# Patient Record
Sex: Female | Born: 1945 | Race: White | Hispanic: No | Marital: Married | Smoking: Never smoker
Health system: Southern US, Community
[De-identification: ages and names within clinical notes are randomized; demographics above are authoritative.]

## PROBLEM LIST (undated history)

## (undated) DIAGNOSIS — S72141A Displaced intertrochanteric fracture of right femur, initial encounter for closed fracture: Secondary | ICD-10-CM

## (undated) DIAGNOSIS — L719 Rosacea, unspecified: Secondary | ICD-10-CM

## (undated) DIAGNOSIS — K5901 Slow transit constipation: Secondary | ICD-10-CM

## (undated) DIAGNOSIS — E785 Hyperlipidemia, unspecified: Secondary | ICD-10-CM

## (undated) DIAGNOSIS — D696 Thrombocytopenia, unspecified: Secondary | ICD-10-CM

## (undated) DIAGNOSIS — N200 Calculus of kidney: Secondary | ICD-10-CM

## (undated) DIAGNOSIS — E119 Type 2 diabetes mellitus without complications: Secondary | ICD-10-CM

## (undated) DIAGNOSIS — G20 Parkinson's disease: Secondary | ICD-10-CM

## (undated) DIAGNOSIS — R2681 Unsteadiness on feet: Secondary | ICD-10-CM

## (undated) DIAGNOSIS — R600 Localized edema: Secondary | ICD-10-CM

## (undated) DIAGNOSIS — G4700 Insomnia, unspecified: Secondary | ICD-10-CM

## (undated) DIAGNOSIS — F329 Major depressive disorder, single episode, unspecified: Secondary | ICD-10-CM

## (undated) DIAGNOSIS — I10 Essential (primary) hypertension: Secondary | ICD-10-CM

## (undated) DIAGNOSIS — D62 Acute posthemorrhagic anemia: Secondary | ICD-10-CM

## (undated) HISTORY — DX: Calculus of kidney: N20.0

## (undated) HISTORY — DX: Thrombocytopenia, unspecified: D69.6

## (undated) HISTORY — PX: OTHER SURGICAL HISTORY: SHX169

## (undated) HISTORY — DX: Displaced intertrochanteric fracture of right femur, initial encounter for closed fracture: S72.141A

## (undated) HISTORY — DX: Type 2 diabetes mellitus without complications: E11.9

## (undated) HISTORY — DX: Slow transit constipation: K59.01

## (undated) HISTORY — DX: Acute posthemorrhagic anemia: D62

## (undated) HISTORY — DX: Unsteadiness on feet: R26.81

## (undated) HISTORY — DX: Major depressive disorder, single episode, unspecified: F32.9

## (undated) HISTORY — DX: Insomnia, unspecified: G47.00

## (undated) HISTORY — DX: Localized edema: R60.0

## (undated) HISTORY — DX: Hyperlipidemia, unspecified: E78.5

## (undated) HISTORY — DX: Rosacea, unspecified: L71.9

## (undated) HISTORY — DX: Essential (primary) hypertension: I10

## (undated) HISTORY — DX: Parkinson's disease: G20

## (undated) HISTORY — PX: TUBAL LIGATION: SHX77

---

## 1998-08-22 ENCOUNTER — Other Ambulatory Visit: Admission: RE | Admit: 1998-08-22 | Discharge: 1998-08-22 | Payer: Self-pay | Admitting: Obstetrics and Gynecology

## 1999-01-25 ENCOUNTER — Encounter: Payer: Self-pay | Admitting: Internal Medicine

## 1999-01-25 ENCOUNTER — Ambulatory Visit (HOSPITAL_COMMUNITY): Admission: RE | Admit: 1999-01-25 | Discharge: 1999-01-25 | Payer: Self-pay | Admitting: Internal Medicine

## 2000-01-28 ENCOUNTER — Ambulatory Visit (HOSPITAL_COMMUNITY): Admission: RE | Admit: 2000-01-28 | Discharge: 2000-01-28 | Payer: Self-pay | Admitting: Internal Medicine

## 2000-01-28 ENCOUNTER — Encounter: Payer: Self-pay | Admitting: Internal Medicine

## 2000-08-19 ENCOUNTER — Emergency Department (HOSPITAL_COMMUNITY): Admission: EM | Admit: 2000-08-19 | Discharge: 2000-08-20 | Payer: Self-pay | Admitting: Emergency Medicine

## 2000-12-31 ENCOUNTER — Encounter: Admission: RE | Admit: 2000-12-31 | Discharge: 2000-12-31 | Payer: Self-pay | Admitting: Internal Medicine

## 2000-12-31 ENCOUNTER — Encounter: Payer: Self-pay | Admitting: Internal Medicine

## 2001-02-08 ENCOUNTER — Emergency Department (HOSPITAL_COMMUNITY): Admission: EM | Admit: 2001-02-08 | Discharge: 2001-02-08 | Payer: Self-pay | Admitting: Emergency Medicine

## 2002-01-18 ENCOUNTER — Other Ambulatory Visit: Admission: RE | Admit: 2002-01-18 | Discharge: 2002-01-18 | Payer: Self-pay | Admitting: Obstetrics and Gynecology

## 2002-01-21 ENCOUNTER — Encounter: Payer: Self-pay | Admitting: Internal Medicine

## 2002-01-21 ENCOUNTER — Encounter: Admission: RE | Admit: 2002-01-21 | Discharge: 2002-01-21 | Payer: Self-pay | Admitting: Orthopedic Surgery

## 2003-01-12 ENCOUNTER — Encounter: Payer: Self-pay | Admitting: Internal Medicine

## 2003-01-12 ENCOUNTER — Encounter: Admission: RE | Admit: 2003-01-12 | Discharge: 2003-01-12 | Payer: Self-pay | Admitting: Internal Medicine

## 2003-01-23 ENCOUNTER — Other Ambulatory Visit: Admission: RE | Admit: 2003-01-23 | Discharge: 2003-01-23 | Payer: Self-pay | Admitting: Obstetrics and Gynecology

## 2003-09-25 ENCOUNTER — Emergency Department (HOSPITAL_COMMUNITY): Admission: EM | Admit: 2003-09-25 | Discharge: 2003-09-25 | Payer: Self-pay | Admitting: Emergency Medicine

## 2004-02-01 ENCOUNTER — Encounter: Admission: RE | Admit: 2004-02-01 | Discharge: 2004-02-01 | Payer: Self-pay | Admitting: Internal Medicine

## 2004-03-12 ENCOUNTER — Ambulatory Visit: Payer: Self-pay | Admitting: Internal Medicine

## 2004-03-28 ENCOUNTER — Ambulatory Visit: Payer: Self-pay | Admitting: Internal Medicine

## 2004-04-16 ENCOUNTER — Ambulatory Visit: Payer: Self-pay | Admitting: Internal Medicine

## 2004-05-13 ENCOUNTER — Ambulatory Visit (HOSPITAL_COMMUNITY): Admission: RE | Admit: 2004-05-13 | Discharge: 2004-05-13 | Payer: Self-pay | Admitting: Urology

## 2004-05-27 ENCOUNTER — Ambulatory Visit (HOSPITAL_COMMUNITY): Admission: RE | Admit: 2004-05-27 | Discharge: 2004-05-27 | Payer: Self-pay | Admitting: Urology

## 2004-07-30 ENCOUNTER — Ambulatory Visit: Payer: Self-pay | Admitting: Internal Medicine

## 2004-09-30 ENCOUNTER — Ambulatory Visit: Payer: Self-pay | Admitting: Internal Medicine

## 2004-11-08 ENCOUNTER — Ambulatory Visit: Payer: Self-pay | Admitting: Internal Medicine

## 2004-12-30 ENCOUNTER — Encounter: Payer: Self-pay | Admitting: Internal Medicine

## 2005-01-13 ENCOUNTER — Ambulatory Visit: Payer: Self-pay | Admitting: Internal Medicine

## 2005-01-21 ENCOUNTER — Encounter: Admission: RE | Admit: 2005-01-21 | Discharge: 2005-01-21 | Payer: Self-pay | Admitting: Internal Medicine

## 2005-01-27 ENCOUNTER — Ambulatory Visit: Payer: Self-pay | Admitting: Internal Medicine

## 2005-05-22 ENCOUNTER — Encounter (INDEPENDENT_AMBULATORY_CARE_PROVIDER_SITE_OTHER): Payer: Self-pay | Admitting: Specialist

## 2005-05-22 ENCOUNTER — Encounter: Admission: RE | Admit: 2005-05-22 | Discharge: 2005-05-22 | Payer: Self-pay | Admitting: Obstetrics and Gynecology

## 2005-09-22 ENCOUNTER — Ambulatory Visit: Payer: Self-pay | Admitting: Internal Medicine

## 2005-10-16 ENCOUNTER — Ambulatory Visit: Payer: Self-pay | Admitting: Internal Medicine

## 2005-11-20 ENCOUNTER — Ambulatory Visit: Payer: Self-pay | Admitting: Internal Medicine

## 2006-02-10 ENCOUNTER — Ambulatory Visit: Payer: Self-pay | Admitting: Internal Medicine

## 2006-05-28 ENCOUNTER — Ambulatory Visit: Payer: Self-pay | Admitting: Internal Medicine

## 2006-08-14 ENCOUNTER — Ambulatory Visit: Payer: Self-pay | Admitting: Internal Medicine

## 2006-08-31 ENCOUNTER — Ambulatory Visit: Payer: Self-pay | Admitting: Internal Medicine

## 2006-08-31 LAB — CONVERTED CEMR LAB
Calcium: 9.4 mg/dL (ref 8.4–10.5)
Chloride: 109 meq/L (ref 96–112)
GFR calc Af Amer: 94 mL/min
GFR calc non Af Amer: 78 mL/min
Glucose, Bld: 157 mg/dL — ABNORMAL HIGH (ref 70–99)
Hgb A1c MFr Bld: 7.3 % — ABNORMAL HIGH (ref 4.6–6.0)
LDL Cholesterol: 61 mg/dL (ref 0–99)
Total CHOL/HDL Ratio: 3.6
VLDL: 38 mg/dL (ref 0–40)

## 2006-09-07 ENCOUNTER — Ambulatory Visit: Payer: Self-pay | Admitting: Internal Medicine

## 2006-10-06 ENCOUNTER — Ambulatory Visit: Payer: Self-pay | Admitting: Endocrinology

## 2006-11-16 ENCOUNTER — Encounter: Payer: Self-pay | Admitting: Internal Medicine

## 2006-11-16 DIAGNOSIS — L719 Rosacea, unspecified: Secondary | ICD-10-CM

## 2006-11-16 DIAGNOSIS — I10 Essential (primary) hypertension: Secondary | ICD-10-CM | POA: Insufficient documentation

## 2006-11-16 DIAGNOSIS — E118 Type 2 diabetes mellitus with unspecified complications: Secondary | ICD-10-CM

## 2006-12-31 ENCOUNTER — Ambulatory Visit: Payer: Self-pay | Admitting: Internal Medicine

## 2007-01-18 LAB — CONVERTED CEMR LAB: Pap Smear: NORMAL

## 2007-01-21 ENCOUNTER — Encounter: Admission: RE | Admit: 2007-01-21 | Discharge: 2007-01-21 | Payer: Self-pay | Admitting: Internal Medicine

## 2007-03-09 ENCOUNTER — Ambulatory Visit: Payer: Self-pay | Admitting: Internal Medicine

## 2007-03-09 LAB — CONVERTED CEMR LAB
Calcium: 9.8 mg/dL (ref 8.4–10.5)
Chloride: 101 meq/L (ref 96–112)
Cholesterol: 105 mg/dL (ref 0–200)
Creatinine, Ser: 0.7 mg/dL (ref 0.4–1.2)
GFR calc non Af Amer: 90 mL/min
Glucose, Bld: 177 mg/dL — ABNORMAL HIGH (ref 70–99)
Hgb A1c MFr Bld: 7.6 % — ABNORMAL HIGH (ref 4.6–6.0)
Sodium: 140 meq/L (ref 135–145)
Total CHOL/HDL Ratio: 3.2
VLDL: 46 mg/dL — ABNORMAL HIGH (ref 0–40)

## 2007-03-11 ENCOUNTER — Encounter: Payer: Self-pay | Admitting: Internal Medicine

## 2007-03-16 ENCOUNTER — Telehealth: Payer: Self-pay | Admitting: Internal Medicine

## 2007-03-22 ENCOUNTER — Encounter: Payer: Self-pay | Admitting: Internal Medicine

## 2007-03-23 ENCOUNTER — Ambulatory Visit: Payer: Self-pay | Admitting: Internal Medicine

## 2007-04-13 ENCOUNTER — Ambulatory Visit: Payer: Self-pay | Admitting: Internal Medicine

## 2007-04-13 DIAGNOSIS — K3189 Other diseases of stomach and duodenum: Secondary | ICD-10-CM | POA: Insufficient documentation

## 2007-04-13 DIAGNOSIS — R1013 Epigastric pain: Secondary | ICD-10-CM

## 2007-04-14 ENCOUNTER — Telehealth: Payer: Self-pay | Admitting: Internal Medicine

## 2007-04-19 ENCOUNTER — Telehealth: Payer: Self-pay | Admitting: Internal Medicine

## 2007-08-19 ENCOUNTER — Telehealth: Payer: Self-pay | Admitting: Internal Medicine

## 2007-08-31 ENCOUNTER — Ambulatory Visit: Payer: Self-pay | Admitting: Internal Medicine

## 2007-09-07 ENCOUNTER — Ambulatory Visit: Payer: Self-pay | Admitting: Internal Medicine

## 2007-09-13 ENCOUNTER — Telehealth (INDEPENDENT_AMBULATORY_CARE_PROVIDER_SITE_OTHER): Payer: Self-pay | Admitting: *Deleted

## 2007-09-22 ENCOUNTER — Ambulatory Visit: Payer: Self-pay | Admitting: Internal Medicine

## 2007-10-04 ENCOUNTER — Telehealth: Payer: Self-pay | Admitting: Internal Medicine

## 2007-10-13 ENCOUNTER — Telehealth: Payer: Self-pay | Admitting: Internal Medicine

## 2007-11-03 ENCOUNTER — Ambulatory Visit: Payer: Self-pay | Admitting: Internal Medicine

## 2007-11-03 DIAGNOSIS — E785 Hyperlipidemia, unspecified: Secondary | ICD-10-CM | POA: Insufficient documentation

## 2007-11-03 DIAGNOSIS — Z9189 Other specified personal risk factors, not elsewhere classified: Secondary | ICD-10-CM | POA: Insufficient documentation

## 2007-11-03 DIAGNOSIS — Z87442 Personal history of urinary calculi: Secondary | ICD-10-CM | POA: Insufficient documentation

## 2007-11-04 LAB — CONVERTED CEMR LAB
ALT: 18 units/L (ref 0–35)
AST: 20 units/L (ref 0–37)
Albumin: 3.9 g/dL (ref 3.5–5.2)
Alkaline Phosphatase: 54 units/L (ref 39–117)
BUN: 9 mg/dL (ref 6–23)
Basophils Relative: 0.7 % (ref 0.0–3.0)
Bilirubin Urine: NEGATIVE
CO2: 31 meq/L (ref 19–32)
Chloride: 100 meq/L (ref 96–112)
Eosinophils Relative: 5.4 % — ABNORMAL HIGH (ref 0.0–5.0)
GFR calc non Af Amer: 67 mL/min
Glucose, Bld: 230 mg/dL — ABNORMAL HIGH (ref 70–99)
Hemoglobin: 12.2 g/dL (ref 12.0–15.0)
Lymphocytes Relative: 27 % (ref 12.0–46.0)
Mucus, UA: NEGATIVE
Neutro Abs: 4.3 10*3/uL (ref 1.4–7.7)
Neutrophils Relative %: 57.1 % (ref 43.0–77.0)
Potassium: 4.8 meq/L (ref 3.5–5.1)
RBC / HPF: NONE SEEN
RBC: 4.06 M/uL (ref 3.87–5.11)
Specific Gravity, Urine: 1.02 (ref 1.000–1.03)
Total Protein, Urine: NEGATIVE mg/dL
Urine Glucose: 100 mg/dL — AB
WBC: 7.6 10*3/uL (ref 4.5–10.5)
pH: 5.5 (ref 5.0–8.0)

## 2007-11-18 ENCOUNTER — Telehealth: Payer: Self-pay | Admitting: Internal Medicine

## 2008-01-10 ENCOUNTER — Encounter: Payer: Self-pay | Admitting: Internal Medicine

## 2008-01-13 ENCOUNTER — Encounter: Payer: Self-pay | Admitting: Internal Medicine

## 2008-02-01 ENCOUNTER — Encounter: Admission: RE | Admit: 2008-02-01 | Discharge: 2008-02-01 | Payer: Self-pay | Admitting: Internal Medicine

## 2008-02-07 ENCOUNTER — Ambulatory Visit: Payer: Self-pay | Admitting: Internal Medicine

## 2008-02-15 ENCOUNTER — Ambulatory Visit: Payer: Self-pay | Admitting: Internal Medicine

## 2008-02-15 LAB — CONVERTED CEMR LAB
Calcium: 9.1 mg/dL (ref 8.4–10.5)
Chloride: 105 meq/L (ref 96–112)
Creatinine, Ser: 0.7 mg/dL (ref 0.4–1.2)
GFR calc non Af Amer: 90 mL/min
HDL: 36.7 mg/dL — ABNORMAL LOW (ref >39.0)
Hgb A1c MFr Bld: 7.4 % — ABNORMAL HIGH (ref 4.6–6.0)
LDL Cholesterol: 43 mg/dL (ref 0–99)
Sodium: 141 meq/L (ref 135–145)
Total CHOL/HDL Ratio: 2.6
Triglycerides: 88 mg/dL (ref 0–149)

## 2008-02-17 ENCOUNTER — Encounter: Payer: Self-pay | Admitting: Internal Medicine

## 2008-02-21 ENCOUNTER — Telehealth: Payer: Self-pay | Admitting: Internal Medicine

## 2008-04-11 ENCOUNTER — Ambulatory Visit: Payer: Self-pay | Admitting: Internal Medicine

## 2008-06-29 ENCOUNTER — Telehealth: Payer: Self-pay | Admitting: Internal Medicine

## 2008-11-06 ENCOUNTER — Ambulatory Visit: Payer: Self-pay | Admitting: Internal Medicine

## 2008-11-07 DIAGNOSIS — E669 Obesity, unspecified: Secondary | ICD-10-CM

## 2008-11-08 LAB — CONVERTED CEMR LAB
BUN: 16 mg/dL (ref 6–23)
Basophils Relative: 0.2 % (ref 0.0–3.0)
Calcium: 9.4 mg/dL (ref 8.4–10.5)
Eosinophils Relative: 5.1 % — ABNORMAL HIGH (ref 0.0–5.0)
GFR calc non Af Amer: 76.8 mL/min (ref >60)
Glucose, Bld: 144 mg/dL — ABNORMAL HIGH (ref 70–99)
HCT: 38 % (ref 36.0–46.0)
Lymphs Abs: 1.9 10*3/uL (ref 0.7–4.0)
MCV: 86.8 fL (ref 78.0–100.0)
Monocytes Absolute: 0.7 10*3/uL (ref 0.1–1.0)
Neutro Abs: 4.1 10*3/uL (ref 1.4–7.7)
RBC: 4.38 M/uL (ref 3.87–5.11)
WBC: 7.1 10*3/uL (ref 4.5–10.5)

## 2008-11-13 ENCOUNTER — Telehealth: Payer: Self-pay | Admitting: Internal Medicine

## 2008-11-21 ENCOUNTER — Telehealth: Payer: Self-pay | Admitting: Internal Medicine

## 2008-11-24 ENCOUNTER — Telehealth: Payer: Self-pay | Admitting: Internal Medicine

## 2008-12-11 ENCOUNTER — Telehealth: Payer: Self-pay | Admitting: Internal Medicine

## 2009-02-01 ENCOUNTER — Encounter: Admission: RE | Admit: 2009-02-01 | Discharge: 2009-02-01 | Payer: Self-pay | Admitting: Internal Medicine

## 2009-02-14 ENCOUNTER — Telehealth (INDEPENDENT_AMBULATORY_CARE_PROVIDER_SITE_OTHER): Payer: Self-pay | Admitting: *Deleted

## 2009-03-02 ENCOUNTER — Telehealth: Payer: Self-pay | Admitting: Internal Medicine

## 2009-03-19 ENCOUNTER — Telehealth: Payer: Self-pay | Admitting: Internal Medicine

## 2009-03-27 ENCOUNTER — Ambulatory Visit: Payer: Self-pay | Admitting: Internal Medicine

## 2009-04-16 ENCOUNTER — Ambulatory Visit: Payer: Self-pay | Admitting: Internal Medicine

## 2009-04-27 ENCOUNTER — Telehealth: Payer: Self-pay | Admitting: Internal Medicine

## 2009-05-14 ENCOUNTER — Telehealth: Payer: Self-pay | Admitting: Internal Medicine

## 2009-06-29 ENCOUNTER — Telehealth: Payer: Self-pay | Admitting: Internal Medicine

## 2009-08-07 ENCOUNTER — Telehealth: Payer: Self-pay | Admitting: Internal Medicine

## 2009-09-10 ENCOUNTER — Telehealth: Payer: Self-pay | Admitting: Internal Medicine

## 2009-09-12 ENCOUNTER — Telehealth: Payer: Self-pay | Admitting: Internal Medicine

## 2009-10-04 ENCOUNTER — Telehealth: Payer: Self-pay | Admitting: Internal Medicine

## 2009-12-21 ENCOUNTER — Ambulatory Visit: Payer: Self-pay | Admitting: Internal Medicine

## 2010-01-11 ENCOUNTER — Encounter: Payer: Self-pay | Admitting: Internal Medicine

## 2010-01-14 ENCOUNTER — Ambulatory Visit: Payer: Self-pay | Admitting: Internal Medicine

## 2010-01-14 ENCOUNTER — Encounter: Payer: Self-pay | Admitting: Internal Medicine

## 2010-01-14 LAB — CONVERTED CEMR LAB
AST: 22 units/L (ref 0–37)
Alkaline Phosphatase: 58 units/L (ref 39–117)
Basophils Relative: 0.6 % (ref 0.0–3.0)
CO2: 29 meq/L (ref 19–32)
Calcium: 10.6 mg/dL — ABNORMAL HIGH (ref 8.4–10.5)
Eosinophils Absolute: 0.3 10*3/uL (ref 0.0–0.7)
Glucose, Bld: 93 mg/dL (ref 70–99)
HCT: 38.7 % (ref 36.0–46.0)
HDL: 43.3 mg/dL (ref >39.00)
Hemoglobin: 13.1 g/dL (ref 12.0–15.0)
Lymphs Abs: 2.5 10*3/uL (ref 0.7–4.0)
MCHC: 33.9 g/dL (ref 30.0–36.0)
MCV: 87.1 fL (ref 78.0–100.0)
Monocytes Absolute: 0.6 10*3/uL (ref 0.1–1.0)
Neutro Abs: 4 10*3/uL (ref 1.4–7.7)
Neutrophils Relative %: 53.8 % (ref 43.0–77.0)
RBC: 4.44 M/uL (ref 3.87–5.11)
Sodium: 142 meq/L (ref 135–145)
Total Bilirubin: 0.8 mg/dL (ref 0.3–1.2)
Total CHOL/HDL Ratio: 2

## 2010-01-23 ENCOUNTER — Telehealth: Payer: Self-pay | Admitting: Internal Medicine

## 2010-02-04 ENCOUNTER — Encounter: Admission: RE | Admit: 2010-02-04 | Discharge: 2010-02-04 | Payer: Self-pay | Admitting: Internal Medicine

## 2010-02-05 ENCOUNTER — Telehealth: Payer: Self-pay | Admitting: Internal Medicine

## 2010-04-04 ENCOUNTER — Telehealth: Payer: Self-pay | Admitting: Internal Medicine

## 2010-04-18 ENCOUNTER — Telehealth: Payer: Self-pay | Admitting: Internal Medicine

## 2010-04-30 NOTE — Progress Notes (Signed)
Summary: Accupril refill  Phone Note Refill Request Message from:  Fax from Pharmacy on April 27, 2009 1:35 PM  Refills Requested: Medication #1:  ACCUPRIL 10 MG  TABS Take 1 tablet by mouth once a day   Dosage confirmed as above?Dosage Confirmed Initial call taken by: Lucious Groves,  April 27, 2009 1:35 PM    Prescriptions: ACCUPRIL 10 MG  TABS (QUINAPRIL HCL) Take 1 tablet by mouth once a day  #30 x 6   Entered by:   Lucious Groves   Authorized by:   Jacques Navy MD   Signed by:   Lucious Groves on 04/27/2009   Method used:   Faxed to ...       [facility] Department (retail)       [address] [place], [place]  [address]       Ph: [phone]       Fax: [phone]   RxID:   [id number]

## 2010-04-30 NOTE — Progress Notes (Signed)
     Diabetes Management Exam:    Eye Exam:       Eye Exam done elsewhere          Date: 01/11/2010          Results: normal          Done by: Dr Francesca Oman

## 2010-04-30 NOTE — Progress Notes (Signed)
  Phone Note Refill Request Message from:  Patient on September 10, 2009 2:43 PM  Refills Requested: Medication #1:  PAXIL 20 MG  TABS once daily Initial call taken by: Ami Bullins CMA,  September 10, 2009 2:43 PM    Prescriptions: PAXIL 20 MG  TABS (PAROXETINE HCL) once daily  #90.0 Each x 3   Entered by:   Ami Bullins CMA   Authorized by:   Jacques Navy MD   Signed by:   Bill Salinas CMA on 09/10/2009   Method used:   Electronically to        [facility] [address] (retail)       [phone]       [place], [place]  [address]       Ph: [phone]       Fax: [phone]   RxID:   [id number]

## 2010-04-30 NOTE — Progress Notes (Signed)
  Phone Note Refill Request Message from:  Fax from Pharmacy on October 04, 2009 3:44 PM  Refills Requested: Medication #1:  ACCUPRIL 10 MG  TABS Take 1 tablet by mouth once a day Initial call taken by: Ami Bullins CMA,  October 04, 2009 3:45 PM    Prescriptions: ACCUPRIL 10 MG  TABS (QUINAPRIL HCL) Take 1 tablet by mouth once a day  #30 x 6   Entered by:   Ami Bullins CMA   Authorized by:   Jacques Navy MD   Signed by:   Bill Salinas CMA on 10/04/2009   Method used:   Faxed to ...       [facility] Department (retail)       [address] [place], [place]  [address]       Ph: [phone]       Fax: [phone]   RxID:   [id number]

## 2010-04-30 NOTE — Progress Notes (Signed)
  Phone Note Refill Request   Refills Requested: Medication #1:  METFORMIN HCL 1000 MG  TABS two times a day Initial call taken by: Rock Nephew CMA,  Aug 07, 2009 3:05 PM    Prescriptions: METFORMIN HCL 1000 MG  TABS (METFORMIN HCL) two times a day  #180 x 3   Entered by:   Rock Nephew CMA   Authorized by:   Jacques Navy MD   Signed by:   Rock Nephew CMA on 08/07/2009   Method used:   Faxed to ...       [facility] Department (retail)       [address] [place], [place]  [address]       Ph: [phone]       Fax: [phone]   RxID:   [id number]

## 2010-04-30 NOTE — Progress Notes (Signed)
Summary: Zostavax  Phone Note Call from Patient   Summary of Call: Pt has not met her deductable for Med Part D. She wants to wait until next year when she meets her deductable to get zostavax. Pt wants to know if this is ok or is it urgent that she gets this now? If now, it will cost around 165 dollars.  Initial call taken by: [name] Sprinkles, CMA,  February 05, 2010 4:47 PM  Follow-up for Phone Call        ok to wait Follow-up by: Jacques Navy MD,  February 05, 2010 6:02 PM  Additional Follow-up for Phone Call Additional follow up Details #1::        Pt informed  Additional Follow-up by: [name] Sprinkles, CMA,  February 05, 2010 6:21 PM

## 2010-04-30 NOTE — Assessment & Plan Note (Signed)
Summary: FLU VAC MEN  STC  Nurse Visit   Allergies: No Known Drug Allergies  Orders Added: 1)  Flu Vaccine 3yrs + MEDICARE PATIENTS [Q2039] 2)  Administration Flu vaccine - MCR [G0008]      Flu Vaccine Consent Questions     Do you have a history of severe allergic reactions to this vaccine? no    Any prior history of allergic reactions to egg and/or gelatin? no    Do you have a sensitivity to the preservative Thimersol? no    Do you have a past history of Guillan-Barre Syndrome? no    Do you currently have an acute febrile illness? no    Have you ever had a severe reaction to latex? no    Vaccine information given and explained to patient? yes    Are you currently pregnant? no    Lot Number:[id number]   Exp Date:09/28/2010   Site Given  Left Deltoid IMu  

## 2010-04-30 NOTE — Assessment & Plan Note (Signed)
Summary: Yearly f/u    Vital Signs:  Patient profile:   65 year old female Height:      65 inches Weight:      208 pounds BMI:     34.74 O2 Sat:      95 % on Room air Temp:     97.1 degrees F oral Pulse rate:   69 / minute BP sitting:   118 / 62  (left arm) Cuff size:   large  Vitals Entered By: Alysia Penna (January 14, 2010 2:49 PM)  O2 Flow:  Room air CC: pt here for cpx. /cp sma   Primary Care Provider:  Norins  CC:  pt here for cpx. /cp sma.  History of Present Illness: Patinet presents for a routine physical exam. She has had a recent case of pink eye - see by Dr. Milus Glazier at Urgent Care and treated with sample antibiotic drops. She has had no other acute medical problems since last visit.   She remains independent ADLs. 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  She has been on a weight loss program. she lost 25lbs and found 10 lbs. With this change she has had better CBGs and has reduced her metformin to 1000mg  AM and 500mg  PM. Her CBGs have been in the 70-'s-80s.  Preventive Screening-Counseling & Management  Alcohol-Tobacco     Alcohol drinks/day: 0     Smoking Status: never  Caffeine-Diet-Exercise     Caffeine use/day: 2-3 glasses/day     Diet Comments: diabetic visit     Does Patient Exercise: no     MSH Depression Score: no depression  Hep-HIV-STD-Contraception     Hepatitis Risk: no risk noted     HIV Risk: no risk noted     STD Risk: no risk noted     Dental Visit-last 6 months no     SBE monthly: no     Sun Exposure-Excessive: no  Safety-Violence-Falls     Seat Belt Use: yes     Firearms in the Home: no firearms in the home     Smoke Detectors: yes     Violence in the Home: no risk noted     Sexual Abuse: no     Fall Risk: no      Sexual History:  currently monogamous.        Drug Use:  never.        Blood Transfusions:  no.    Current Medications (verified): 1)  Glucotrol Xl  10 Mg Xr24h-Tab (Glipizide) .... Take 1 Tablet By Mouth Once A Day 2)  Actos 30 Mg Tabs (Pioglitazone Hcl) .... One Daily - Stop Avandia 3)  Metformin Hcl 1000 Mg  Tabs (Metformin Hcl) .... Two Times A Day 4)  Paxil 20 Mg  Tabs (Paroxetine Hcl) .... Once Daily 5)  Accupril 10 Mg  Tabs (Quinapril Hcl) .... Take 1 Tablet By Mouth Once A Day 6)  Crestor 10 Mg  Tabs (Rosuvastatin Calcium) .... Every Pm 7)  Furosemide 40 Mg  Tabs (Furosemide) .Marland Kitchen.. 1 Once Daily 8)  Zyrtec Allergy 10 Mg Tabs (Cetirizine Hcl) .... Take 1 Tablet By Mouth Once A Day As Needed 9)  Aspirin 81 Mg  Tabs (Aspirin) .... Take 1 Tablet By Mouth Once A Day 10)  Multivitamins   Tabs (Multiple Vitamin) .... Take 1 Tablet By Mouth Once A Day 11)  Calcium 600 600 Mg Tabs (Calcium Carbonate) .... Take 1 Tablet By  Mouth Once A Day 12)  Vitamin D 400 Unit Tabs (Cholecalciferol) .... Take 1 Tablet By Mouth Once A Day 13)  Vitamin E .... Take 1 Tablet By Mouth Once A Day 14)  Vitamin B .... Take 1 Tablet By Mouth Once A Day 15)  Betamethasone Dipropionate 0.05 % Crea (Betamethasone Dipropionate) .... Apply Two Times A Day As Needed 16)  Promethazine-Codeine 6.25-10 Mg/49ml Syrp (Promethazine-Codeine) .Marland Kitchen.. 1 Tsp Q 6 For Cough. May Take 2 Tsp At Bedtime. 17)  Prednisone 20 Mg Tabs (Prednisone) .Marland Kitchen.. 1 By Mouth Once Daily 18)  Benzonatate 100 Mg Caps (Benzonatate) .Marland Kitchen.. 1 By Mouth Three Times A Day 19)  Gas-X Extra Strength 125 Mg Caps (Simethicone) .Marland Kitchen.. 1 Tablet 3 Times A Day 20)  Aleve 220 Mg Tabs (Naproxen Sodium) .Marland Kitchen.. 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 married 1966 3 sons - 1968, 1974, 80 3 grandchildren retired(disability) Lives with SO - I-ADLs  Risk Factors: Alcohol Use: 0 (01/14/2010) Caffeine Use: 2-3 glasses/day (01/14/2010) Diet: diabetic visit (01/14/2010) Exercise: no (01/14/2010)  Risk Factors: Smoking Status: never (01/14/2010)  Social History: Caffeine use/day:  2-3 glasses/day Hepatitis Risk:  no risk noted HIV Risk:  no risk noted STD Risk:  no risk noted Dental Care w/in 6 mos.:  no Sun Exposure-Excessive:  no Seat Belt Use:  yes Fall Risk:  no Sexual History:  currently monogamous Drug Use:  never Blood Transfusions:  no  Review of Systems       The patient complains of weight loss and peripheral edema.  The patient denies anorexia, fever, weight gain, vision loss, decreased hearing, chest pain, syncope, dyspnea on exertion, headaches, abdominal pain, severe indigestion/heartburn, incontinence, transient blindness, difficulty walking, depression, abnormal bleeding, and angioedema.         lost 25lbs, regained 10lb - net loss 15 lbs  Physical Exam  General:  obese white female in no acute distress Head:  Normocephalic and atraumatic without obvious abnormalities. No apparent alopecia or balding. Eyes:  vision grossly intact, pupils equal, pupils round, pupils reactive to light, corneas and lenses clear, and no injection.   Ears:  External ear exam shows no significant lesions or deformities.  Otoscopic examination reveals clear canals, tympanic membranes are intact bilaterally without bulging, retraction, inflammation or discharge. Hearing is grossly normal bilaterally. Nose:  no external deformity and no external erythema.   Mouth:  missing many teeth. No obvious caries in remaining teeth. No buccal lesions, no palatal lesions, throat  clear. Neck:  supple, full ROM, no thyromegaly, and no carotid bruits.   Chest Wall:  No deformities, masses, or tenderness noted. Breasts:  No mass, nodules, thickening, tenderness, bulging, retraction, inflamation, nipple discharge or skin changes noted.   Lungs:  Normal respiratory effort, chest expands symmetrically. Lungs are clear to auscultation, no crackles or wheezes. Heart:  Normal rate and regular rhythm. S1 and S2 normal without gallop, murmur, click, rub or other extra sounds. Abdomen:  soft, non-tender, normal bowel sounds, no guarding, no rigidity, and no hepatomegaly.  Genitalia:  deferred Msk:  no joint tenderness, no joint swelling, no joint warmth, no redness over joints, and no joint deformities.   Pulses:  2+ radial and DP pulses Extremities:  No clubbing, cyanosis, edema, or deformity noted with normal full range of motion of all joints.   Neurologic:  alert & oriented X3, cranial nerves II-XII intact, strength normal in all extremities, gait normal, and DTRs symmetrical and normal.   Skin:  turgor normal, color normal, and no suspicious lesions.   Cervical Nodes:  no anterior cervical adenopathy and no posterior cervical adenopathy.   Axillary Nodes:  no R axillary adenopathy and no L axillary adenopathy.   Psych:  Oriented X3, normally interactive, good eye contact, and not anxious appearing.     Impression & Recommendations:  Problem # 1:  OBESITY, CLASS III (ICD-278.01) She has been loosing weight and is encouraged to continue her weight management program. Target is 180 lbs, goal is to loose 1 lb a month.  Problem # 2:  HYPERLIPIDEMIA (ICD-272.4)  Her updated medication list for this problem includes:    Crestor 10 Mg Tabs (Rosuvastatin calcium) ..... Every pm  Orders: TLB-Lipid Panel (80061-LIPID) TLB-Hepatic/Liver Function Pnl (80076-HEPATIC) TLB-TSH (Thyroid Stimulating Hormone) (84443-TSH)  Addendum - excellent control. Plan - continue present dose of  crestor.  Problem # 3:  DYSPEPSIA (ICD-536.8) Stable with no active signs of irritation or bleeding.   Orders: TLB-CBC Platelet - w/Differential (85025-CBCD)  Problem # 4:  ROSACEA (ICD-695.3) Minimal facial erythema at this time. NO need to re-institute therapy  Problem # 5:  HYPERTENSION (ICD-401.9)  Her updated medication list for this problem includes:    Accupril 10 Mg Tabs (Quinapril hcl) .Marland Kitchen... Take 1 tablet by mouth once a day    Furosemide 40 Mg Tabs (Furosemide) .Marland Kitchen... 1 once daily  Orders: TLB-BMP (Basic Metabolic Panel-BMET) (80048-METABOL)  BP today: 118/62 Prior BP: 120/60 (04/16/2009)  Very good control on present medical regimen.  Problem # 6:  DIABETES MELLITUS, TYPE II (ICD-250.00)  Her updated medication list for this problem includes:    Glucotrol Xl 10 Mg Xr24h-tab (Glipizide) .Marland Kitchen... Take 1 tablet by mouth once a day    Actos 30 Mg Tabs (Pioglitazone hcl) ..... One daily - stop avandia    Metformin Hcl 1000 Mg Tabs (Metformin hcl) .Marland Kitchen..Marland Kitchen Two times a day    Accupril 10 Mg Tabs (Quinapril hcl) .Marland Kitchen... Take 1 tablet by mouth once a day    Aspirin 81 Mg Tabs (Aspirin) .Marland Kitchen... Take 1 tablet by mouth once a day  Orders: TLB-A1C / Hgb A1C (Glycohemoglobin) (83036-A1C)  Addendum - A1C 6.3% = GREAT control. Plan continue lifestyle managment and present medications.  Problem # 7:  Preventive Health Care (ICD-V70.0)  History remarkable for weight loss with subsequent decreased need for diabetes medications. Physical exam without abnormality except for weight. Lab results are within normal limits and look great. No colonoscopy on record. She is counselled as to the importance of screening for colorectal cancer. Last mammogram Nov '10. Immunizations: tetnus March '05, peumonia vaccine today. She will look into coverage for shingles vaccine.   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  IN summary - a  nice woman with multiple medical problems who appears to be stable at today's exam. She will return for routine follow-up in 6 months.   Orders: Medicare -1st Annual Wellness Visit [phone])  Complete Medication List: 1)  Glucotrol Xl  10 Mg Xr24h-tab (Glipizide) .... Take 1 tablet by mouth once a day 2)  Actos 30 Mg Tabs (Pioglitazone hcl) .... One daily - stop avandia 3)  Metformin Hcl 1000 Mg Tabs (Metformin hcl) .... Two times a day 4)  Paxil 20 Mg Tabs (Paroxetine hcl) .... Once daily 5)  Accupril 10 Mg Tabs (Quinapril hcl) .... Take 1 tablet by mouth once a day 6)  Crestor 10 Mg Tabs (Rosuvastatin calcium) .... Every pm 7)  Furosemide 40 Mg Tabs (Furosemide) .Marland Kitchen.. 1 once daily 8)  Zyrtec Allergy 10 Mg Tabs (Cetirizine hcl) .... Take 1 tablet by mouth once a day as needed 9)  Aspirin 81 Mg Tabs (Aspirin) .... Take 1 tablet by mouth once a day 10)  Multivitamins Tabs (Multiple vitamin) .... Take 1 tablet by mouth once a day 11)  Calcium 600 600 Mg Tabs (Calcium carbonate) .... Take 1 tablet by mouth once a day 12)  Vitamin D 400 Unit Tabs (Cholecalciferol) .... Take 2 tablets by mouth once a day 13)  Vitamin E  .... Take 1 tablet by mouth once a day 14)  Vitamin B  .... Take 1 tablet by mouth once a day 15)  Betamethasone Dipropionate 0.05 % Crea (Betamethasone dipropionate) .... Apply two times a day as needed 16)  Gas-x Extra Strength 125 Mg Caps (Simethicone) .Marland Kitchen.. 1 tablet 3 times a day 17)  Aleve 220 Mg Tabs (Naproxen sodium) .Marland Kitchen.. 1 tablet a day  Other Orders: Pneumococcal Vaccine ([address]) Admin 1st Vaccine (82956)  Patient: Barbara Bradshaw Note: All result statuses are Final unless otherwise noted.  Tests: (1) Lipid Panel (LIPID)   Cholesterol               108 mg/dL                   2-130     ATP III Classification            Desirable:  < 200 mg/dL                    Borderline High:  200 - 239 mg/dL               High:  > = 240 mg/dL   Triglycerides             108.0 mg/dL                  8.6-578.4     Normal:  <150 mg/dL     Borderline High:  696 - 199 mg/dL   HDL                       29.52 mg/dL                 >84.13   VLDL Cholesterol          21.6 mg/dL                  2.4-40.1   LDL Cholesterol           43 mg/dL                    0-27  CHO/HDL Ratio:  CHD Risk                             2  Men          Women     1/2 Average Risk     3.4          3.3     Average Risk          5.0          4.4     2X Average Risk          9.6          7.1     3X Average Risk          15.0          11.0                           Tests: (2) Hepatic/Liver Function Panel (HEPATIC)   Total Bilirubin           0.8 mg/dL                   1.3-0.8   Direct Bilirubin          0.1 mg/dL                   6.5-7.8   Alkaline Phosphatase      58 U/L                      39-117   AST                       22 U/L                      0-37   ALT                       18 U/L                      0-35   Total Protein             7.2 g/dL                    4.6-9.6   Albumin                   4.5 g/dL                    2.9-5.2  Tests: (3) BMP (METABOL)   Sodium                    142 mEq/L                   135-145   Potassium                 4.7 mEq/L                   3.5-5.1   Chloride                  102 mEq/L                   96-112   Carbon Dioxide            29 mEq/L                    19-32   Glucose  93 mg/dL                    04-54   BUN                       18 mg/dL                    0-98   Creatinine                0.9 mg/dL                   1.1-9.1   Calcium              [H]  10.6 mg/dL                  4.7-82.9   GFR                       65.12 mL/min                >60  Tests: (4) Hemoglobin A1C (A1C)   Hemoglobin A1C            6.3 %                       4.6-6.5     Glycemic Control Guidelines for People with Diabetes:     Non Diabetic:  <6%     Goal of Therapy: <7%     Additional Action Suggested:  >8%   Tests:  (5) CBC Platelet w/Diff (CBCD)   White Cell Count          7.4 K/uL                    4.5-10.5   Red Cell Count            4.44 Mil/uL                 3.87-5.11   Hemoglobin                13.1 g/dL                   56.2-13.0   Hematocrit                38.7 %                      36.0-46.0   MCV                       87.1 fl                     78.0-100.0   MCHC                      33.9 g/dL                   86.5-78.4   RDW                       13.6 %                      11.5-14.6   Platelet Count       [L]  148.0 K/uL  150.0-400.0   Neutrophil %              53.8 %                      43.0-77.0   Lymphocyte %              34.5 %                      12.0-46.0   Monocyte %                7.5 %                       3.0-12.0   Eosinophils%              3.6 %                       0.0-5.0   Basophils %               0.6 %                       0.0-3.0   Neutrophill Absolute      4.0 K/uL                    1.4-7.7   Lymphocyte Absolute       2.5 K/uL                    0.7-4.0   Monocyte Absolute         0.6 K/uL                    0.1-1.0  Eosinophils, Absolute                             0.3 K/uL                    0.0-0.7   Basophils Absolute        0.0 K/uL                    0.0-0.1  Tests: (6) TSH (TSH)   FastTSH                   0.81 uIU/mL                 0.35-5.50  Orders Added: 1)  TLB-Lipid Panel [80061-LIPID] 2)  TLB-Hepatic/Liver Function Pnl [80076-HEPATIC] 3)  TLB-BMP (Basic Metabolic Panel-BMET) [80048-METABOL] 4)  TLB-A1C / Hgb A1C (Glycohemoglobin) [83036-A1C] 5)  TLB-CBC Platelet - w/Differential [85025-CBCD] 6)  TLB-TSH (Thyroid Stimulating Hormone) [84443-TSH] 7)  Pneumococcal Vaccine [90732] 8)  Admin 1st Vaccine [90471] 9)  Medicare -1st Annual Wellness Visit [G0438] 10)  Est. Patient Level III [62130]   Immunizations Administered:  Pneumonia Vaccine:    Vaccine Type: Pneumovax    Site: left deltoid    Mfr: Merck    Dose: 0.5  ml    Route: IM    Given by: Ami Bullins CMA    Exp. Date: 06/17/2011    Lot #: [id number]    VIS given: 03/05/09 version given January 14, 2010.   Immunizations Administered:  Pneumonia Vaccine:    Vaccine Type: Pneumovax    Site: left deltoid    Mfr: Merck  Dose: 0.5 ml    Route: IM    Given by: Ami Bullins CMA    Exp. Date: 06/17/2011    Lot #: [id number]    VIS given: 03/05/09 version given January 14, 2010.

## 2010-04-30 NOTE — Progress Notes (Signed)
  Phone Note Refill Request Message from:  Fax from Pharmacy on June 29, 2009 11:50 AM  Refills Requested: Medication #1:  FUROSEMIDE 40 MG  TABS 1 once daily    Prescriptions: FUROSEMIDE 40 MG  TABS (FUROSEMIDE) 1 once daily  #100 x 3   Entered by:   Ami Bullins CMA   Authorized by:   Jacques Navy MD   Signed by:   Bill Salinas CMA on 06/29/2009   Method used:   Faxed to ...       [facility] Department (retail)       [address] [place], [place]  [address]       Ph: [phone]       Fax: [phone]   RxID:   [id number]

## 2010-04-30 NOTE — Progress Notes (Signed)
Summary: Crestor refill  Phone Note Refill Request Message from:  Fax from Pharmacy on May 14, 2009 2:33 PM  Refills Requested: Medication #1:  CRESTOR 10 MG  TABS every PM   Dosage confirmed as above?Dosage Confirmed Initial call taken by: Lucious Groves,  May 14, 2009 2:33 PM    Prescriptions: CRESTOR 10 MG  TABS (ROSUVASTATIN CALCIUM) every PM  #90 x 2   Entered by:   Lucious Groves   Authorized by:   Jacques Navy MD   Signed by:   Lucious Groves on 05/14/2009   Method used:   Faxed to ...       [facility] Department (retail)       [address] [place], [place]  [address]       Ph: [phone]       Fax: [phone]   RxID:   [id number]

## 2010-04-30 NOTE — Letter (Signed)
Summary: [facility]  [facility]   Imported By: Sherian Rein 01/25/2010 14:51:09  _____________________________________________________________________  External Attachment:    Type:   Image     Comment:   External Document

## 2010-04-30 NOTE — Progress Notes (Signed)
  Phone Note Refill Request Message from:  Fax from Pharmacy on September 12, 2009 11:13 AM  Refills Requested: Medication #1:  ACTOS 30 MG TABS one daily - stop avandia Initial call taken by: Ami Bullins CMA,  September 12, 2009 11:13 AM    Prescriptions: ACTOS 30 MG TABS (PIOGLITAZONE HCL) one daily - stop avandia  #90 x 1   Entered by:   Ami Bullins CMA   Authorized by:   Jacques Navy MD   Signed by:   Bill Salinas CMA on 09/12/2009   Method used:   Faxed to ...       [facility] Department (retail)       [address] [place], [place]  [address]       Ph: [phone]       Fax: [phone]   RxID:   [id number]

## 2010-04-30 NOTE — Assessment & Plan Note (Signed)
Summary: upper resp inf not resolved/#/cd   Vital Signs:  Patient profile:   65 year old female Height:      65 inches Weight:      199 pounds BMI:     33.24 O2 Sat:      98 % on Room air Temp:     97.7 degrees F oral Pulse rate:   86 / minute BP sitting:   120 / 60  (left arm) Cuff size:   large  Vitals Entered By: Bill Salinas CMA (April 16, 2009 3:20 PM)  O2 Flow:  Room air CC: pt presents today with head congestion and coughing, she also c/o of weakness and fatigue/ ab   Primary Care Provider:  Norins  CC:  pt presents today with head congestion and coughing and she also c/o of weakness and fatigue/ ab.  History of Present Illness: Seen December 28th for URI treated with Amox 875mg  two times a day for 7 days and supportive care. she reports that she did improve but once she finished the antibiotics she had recurrent symptoms: no fever, + for cough, non-productive, she reports being SOB, no N/V/D, no facial sinus pain or pressure. she has not responded to mucinex DM did not help relieve her symptoms. She has lost 8 lbs since her last visit.   Current Medications (verified): 1)  Glucotrol Xl 10 Mg Xr24h-Tab (Glipizide) .... Take 1 Tablet By Mouth Once A Day 2)  Actos 30 Mg Tabs (Pioglitazone Hcl) .... One Daily - Stop Avandia 3)  Metformin Hcl 1000 Mg  Tabs (Metformin Hcl) .... Two Times A Day 4)  Paxil 20 Mg  Tabs (Paroxetine Hcl) .... Once Daily 5)  Accupril 10 Mg  Tabs (Quinapril Hcl) .... Take 1 Tablet By Mouth Once A Day 6)  Crestor 10 Mg  Tabs (Rosuvastatin Calcium) .... Every Pm 7)  Furosemide 40 Mg  Tabs (Furosemide) .Marland Kitchen.. 1 Once Daily 8)  Zyrtec Allergy 10 Mg Tabs (Cetirizine Hcl) .... Take 1 Tablet By Mouth Once A Day As Needed 9)  Aspirin 81 Mg  Tabs (Aspirin) .... Take 1 Tablet By Mouth Once A Day 10)  Multivitamins   Tabs (Multiple Vitamin) .... Take 1 Tablet By Mouth Once A Day 11)  Calcium 600 600 Mg Tabs (Calcium Carbonate) .... Take 1 Tablet By Mouth Once A  Day 12)  Vitamin D 400 Unit Tabs (Cholecalciferol) .... Take 1 Tablet By Mouth Once A Day 13)  Vitamin E .... Take 1 Tablet By Mouth Once A Day 14)  Vitamin B .... Take 1 Tablet By Mouth Once A Day 15)  Betamethasone Dipropionate 0.05 % Crea (Betamethasone Dipropionate) .... Apply Two Times A Day As Needed 16)  Promethazine-Codeine 6.25-10 Mg/75ml Syrp (Promethazine-Codeine) .Marland Kitchen.. 1 Tsp Q 6 For Cough. May Take 2 Tsp At Bedtime.  Allergies (verified): No Known Drug Allergies PMH-FH-SH reviewed-no changes except otherwise noted  Review of Systems       The patient complains of weight loss, hoarseness, chest pain, dyspnea on exertion, prolonged cough, and headaches.  The patient denies anorexia, fever, weight gain, decreased hearing, syncope, peripheral edema, abdominal pain, melena, severe indigestion/heartburn, muscle weakness, difficulty walking, depression, and angioedema.    Physical Exam  General:  overweight white female in no distress Head:  Normocephalic and atraumatic without obvious abnormalities. No apparent alopecia or balding. No tenderness to percussion over the frontal or maxillary sinuses Eyes:  corneas and lenses clear and no injection.   Ears:  TMs normal  Mouth:  Throat without erythema or exudate Neck:  supple, full ROM, and no masses.   Lungs:  normal respiratory effort, no intercostal retractions, no accessory muscle use, normal breath sounds, no dullness, no crackles, and no wheezes.   Heart:  normal rate and regular rhythm.     Impression & Recommendations:  Problem # 1:  COUGH (ICD-786.2) post-infectious cyclical cough with no evidence or sign  c/w bacterial infection.  Plan - prednisone 20mg  once daily x 7, benzonatate 100mg  three times a day; prom/cod 1 tsp q 6  Complete Medication List: 1)  Glucotrol Xl 10 Mg Xr24h-tab (Glipizide) .... Take 1 tablet by mouth once a day 2)  Actos 30 Mg Tabs (Pioglitazone hcl) .... One daily - stop avandia 3)  Metformin Hcl  1000 Mg Tabs (Metformin hcl) .... Two times a day 4)  Paxil 20 Mg Tabs (Paroxetine hcl) .... Once daily 5)  Accupril 10 Mg Tabs (Quinapril hcl) .... Take 1 tablet by mouth once a day 6)  Crestor 10 Mg Tabs (Rosuvastatin calcium) .... Every pm 7)  Furosemide 40 Mg Tabs (Furosemide) .Marland Kitchen.. 1 once daily 8)  Zyrtec Allergy 10 Mg Tabs (Cetirizine hcl) .... Take 1 tablet by mouth once a day as needed 9)  Aspirin 81 Mg Tabs (Aspirin) .... Take 1 tablet by mouth once a day 10)  Multivitamins Tabs (Multiple vitamin) .... Take 1 tablet by mouth once a day 11)  Calcium 600 600 Mg Tabs (Calcium carbonate) .... Take 1 tablet by mouth once a day 12)  Vitamin D 400 Unit Tabs (Cholecalciferol) .... Take 1 tablet by mouth once a day 13)  Vitamin E  .... Take 1 tablet by mouth once a day 14)  Vitamin B  .... Take 1 tablet by mouth once a day 15)  Betamethasone Dipropionate 0.05 % Crea (Betamethasone dipropionate) .... Apply two times a day as needed 16)  Promethazine-codeine 6.25-10 Mg/86ml Syrp (Promethazine-codeine) .Marland Kitchen.. 1 tsp q 6 for cough. may take 2 tsp at bedtime. 17)  Prednisone 20 Mg Tabs (Prednisone) .Marland Kitchen.. 1 by mouth once daily 18)  Benzonatate 100 Mg Caps (Benzonatate) .Marland Kitchen.. 1 by mouth three times a day  Patient Instructions: 1)  Cough - no evidence on exam or by history of a bacterial infection. The cough is consistent with a pos-infectious cyclical cough. Plan - no antibiotic unless you develop fever or purulent snot/sputum; prednisone 20 mg daily x 7 days, benzonatate 100mg  three times a day x 7 days and promethazine with codeine syrup 1 tsp every 6 hours while awake.  Prescriptions: BENZONATATE 100 MG CAPS (BENZONATATE) 1 by mouth three times a day  #21 x 0   Entered and Authorized by:   Jacques Navy MD   Signed by:   Jacques Navy MD on 04/16/2009   Method used:   Electronically to        [facility]. [phone]* (retail)       [address]       [place], [place]  [address]       Ph: [phone]       Fax: [phone]   RxID:   [id number] PREDNISONE 20 MG TABS (PREDNISONE) 1 by mouth once daily  #7 x 0   Entered and Authorized by:   Jacques Navy MD   Signed by:   Jacques Navy MD on 04/16/2009   Method used:   Electronically to  [facility] [address] [phone]* (retail)       [address]       [place], [place]  [address]       Ph: [phone]       Fax: [phone]   RxID:   [id number]    Immunization History:  Tetanus/Td Immunization History:    Tetanus/Td:  historical (06/03/2003)

## 2010-05-02 NOTE — Progress Notes (Signed)
Summary: Crestor??  Phone Note Call from Patient Call back at [facility] Phone [phone]   Caller: Patient Summary of Call: pt left vm stating she is out of Crestor. She is requesting that Lipitor be sent in if appropriate...Marland KitchenMarland Kitchenif not she needs Rf on Crestor sent to [facility] . Please advise Initial call taken by: Lanier Prude, [facility]),  April 04, 2010 4:48 PM  Follow-up for Phone Call        no advantage lipitor over crestor. Price is probably very similar. I would recommend staying with the crestor Follow-up by: Jacques Navy MD,  April 04, 2010 6:50 PM  Additional Follow-up for Phone Call Additional follow up Details #1::        Patient notified and rx sent in.Alvy Beal Archie CMA  April 05, 2010 12:36 PM     Prescriptions: CRESTOR 10 MG  TABS (ROSUVASTATIN CALCIUM) every PM  #90 x 3   Entered by:   Rock Nephew CMA   Authorized by:   Jacques Navy MD   Signed by:   Rock Nephew CMA on 04/05/2010   Method used:   Electronically to        [facility] [address] (retail)       [phone]       [place], [place]  [address]       Ph: [phone]       Fax: [phone]   RxID:   [id number]

## 2010-05-02 NOTE — Progress Notes (Signed)
  Phone Note Refill Request      Prescriptions: CRESTOR 10 MG  TABS (ROSUVASTATIN CALCIUM) every PM  #90 x 3   Entered by:   Ami Bullins CMA   Authorized by:   Jacques Navy MD   Signed by:   Bill Salinas CMA on 04/18/2010   Method used:   Faxed to ...       [facility] (mail-order)             , [place]         Ph: [phone]       Fax: [phone]   RxID:   [id number] ACCUPRIL 10 MG  TABS (QUINAPRIL HCL) Take 1 tablet by mouth once a day  #90 x 3   Entered by:   Ami Bullins CMA   Authorized by:   Jacques Navy MD   Signed by:   Bill Salinas CMA on 04/18/2010   Method used:   Faxed to ...       [facility] (mail-order)             , [place]         Ph: [phone]       Fax: [phone]   RxID:   [id number] ACTOS 30 MG TABS (PIOGLITAZONE HCL) one daily - stop avandia  #90 x 3   Entered by:   Ami Bullins CMA   Authorized by:   Jacques Navy MD   Signed by:   Bill Salinas CMA on 04/18/2010   Method used:   Faxed to ...       [facility] (mail-order)             , [place]         Ph: [phone]       Fax: [phone]   RxID:   [id number]

## 2010-05-29 ENCOUNTER — Encounter: Payer: Self-pay | Admitting: Internal Medicine

## 2010-05-29 ENCOUNTER — Telehealth (INDEPENDENT_AMBULATORY_CARE_PROVIDER_SITE_OTHER): Payer: Self-pay | Admitting: *Deleted

## 2010-06-06 NOTE — Progress Notes (Signed)
Summary: Jury Duty  Phone Note Call from Patient   Summary of Call: Pt recieved a federal jury summons. She feels that she can not "hold up" to this physically and mentally. Can not sit or stand for long periods of time and feels that memory is getting worse. She will come in for office visit if needed. Patient is requesting letter from MD excusing her from this and needs it asap.  Initial call taken by: [name] Sprinkles, CMA,  May 29, 2010 11:25 AM  Follow-up for Phone Call        k. Letter in EMR Follow-up by: Jacques Navy MD,  May 29, 2010 1:35 PM  Additional Follow-up for Phone Call Additional follow up Details #1::        printed letter for MD signature.will call Pt and inform. Additional Follow-up by: [facility]),  May 30, 2010 10:07 AM    Additional Follow-up for Phone Call Additional follow up Details #2::    Informed Pt letter is ready.She will PU at office. Follow-up by: [facility]),  May 30, 2010 2:14 PM

## 2010-06-06 NOTE — Letter (Signed)
Summary: [name]  [facility]  [address] [place], [place] [address]   Phone: [phone]  Fax: [phone]    To: [name]  From: Illene Regulus MD  05/29/2010  Patient's Name: Barbara Bradshaw Date of Birth: 08/16/1945   The above patient is physically or mentally unable to perform jury duty.

## 2010-07-09 ENCOUNTER — Other Ambulatory Visit: Payer: Self-pay | Admitting: *Deleted

## 2010-07-09 MED ORDER — PIOGLITAZONE HCL 30 MG PO TABS: 30.0000 mg | ORAL_TABLET | Freq: Every day | ORAL | Status: DC

## 2010-07-26 ENCOUNTER — Telehealth: Payer: Self-pay | Admitting: *Deleted

## 2010-07-26 MED ORDER — FUROSEMIDE 40 MG PO TABS: 40.0000 mg | ORAL_TABLET | Freq: Every day | ORAL | Status: DC

## 2010-07-26 MED ORDER — PAROXETINE HCL 20 MG PO TABS: 20.0000 mg | ORAL_TABLET | Freq: Every day | ORAL | Status: DC

## 2010-07-26 NOTE — Telephone Encounter (Signed)
Patient requesting REFILLs on furosemide and paroxetine at [facility]

## 2010-08-14 ENCOUNTER — Other Ambulatory Visit: Payer: Self-pay | Admitting: Internal Medicine

## 2010-08-16 NOTE — Assessment & Plan Note (Signed)
[facility]                             PRIMARY CARE OFFICE NOTE   NAME:Bagsby, OTHELLA SLAPPEY                       MRN:          [id number]  DATE:02/10/2006                            DOB:          1945-04-17    Ms. Meetze is a 65 year old Caucasian woman who presents for followup  evaluation and exam.  She was last seen in the office November 20, 2005, for a  flare of her allergies as well as hypertension.  She reports that her  symptoms are doing well at this time.   PAST MEDICAL HISTORY:  Well documented in my note of January 27, 2005,  without any significant change.   FAMILY HISTORY:  Noncontributory.   INTERVAL SOCIAL HISTORY:  The patient's mother with Alzheimer's disease was  placed in a nursing home yesterday.  This has been a real strain on the  patient and her family.  The patient is keeping her 85-month-old grandson 2  to 3 days a week.   REVIEW OF SYSTEMS:  Negative for any constitutional, cardiovascular,  respiratory, GI or GU problems.   REVIEW OF SYSTEMS:  Negative for any constitutional problems, no opthalmic,  ENT, cardiovascular, respiratory or GI complaints except as noted above.   CURRENT MEDICATIONS:  1. Glucotrol 10 mg a.c.  2. Calcium with D daily.  3. Baby aspirin 81 mg 2 or 3 tablets daily.  4. Avandia 4 mg daily.  5. Glucophage 500 mg b.i.d.  6. Paxil 20 mg daily.  7. Monopril 10 mg daily.   PHYSICAL EXAMINATION:  Temperature was 98.3, blood pressure 134/80, pulse  89, weight 220.  Exam was performed by Stevphen Meuse, FNP student, with my followup.  This is a generally obese patient in no acute distress.  HEENT:  Normocephalic, atraumatic.  EACs and TMs were unremarkable.  Funduscopic exam revealed normal pupils, question of early cataracts.  There  were no vascular changes, disk margins were sharp.  Oropharynx without  lesions.  NECK:  Supple without thyromegaly.  NODES:  No adenopathy was noted in the cervical  or supraclavicular regions.  CHEST:  No CVA tenderness.  LUNGS:  Clear to auscultation and percussion.  CARDIOVASCULAR:  2+ radial pulses, no JVD or no carotid bruits.  She had a  regular rate and rhythm without murmurs, rubs or gallops.  BREAST:  Exam deferred to Dr. Kyra Manges.  ABDOMEN:  Obese, soft with positive bowel sounds, with no organosplenomegaly  noted.  PELVIC and RECTAL:  Exams deferred to Dr. Elana Alm.  EXTREMITIES:  Without clubbing, cyanosis or edema, no deformities were  noted.   Laboratory ordered and pending includes a lipid panel, hemoglobin A1c,  metabolic panel.   ASSESSMENT AND PLAN:  1. Diabetes.  The patient's last hemoglobin A1c 6.9% September 22, 2005.  The      patient does try to follow a close diet.  The plan is the patient is to      continue on her present medical regimen.  Of note, the patient's last      lipid panel from October of 2006 did reveal  an LDL of 73, which is      excellent, and at goal for a diabetic.  2. Hypertension.  I think this blood pressure is adequately controlled at      134/80.  She will continue on Monopril, but we will increase the dose      to 20 mg daily to try to get better control with a systolic blood      pressure less than 130.  3. Rosacea.  Stable.  4. Psychosocial.  The patient is doing well on Paxil.   The patient will be notified by phone tree of her lab results when  available.  She will return to see me on a p.r.n. basis.     Rosalyn Gess Norins, MD  Electronically Signed    MEN/MedQ  DD: 02/10/2006  DT: 02/11/2006  Job #: [id number]   cc:   Marisa Cyphers. Dory Larsen. Kyra Manges, M.D.

## 2010-10-14 ENCOUNTER — Ambulatory Visit
Admission: RE | Admit: 2010-10-14 | Discharge: 2010-10-14 | Disposition: A | Discharge status: Home / Self Care | Payer: Self-pay | Source: Ambulatory Visit | Attending: Orthopedic Surgery | Admitting: Orthopedic Surgery

## 2010-10-14 ENCOUNTER — Other Ambulatory Visit: Payer: Self-pay | Admitting: Orthopedic Surgery

## 2010-10-14 DIAGNOSIS — W19XXXA Unspecified fall, initial encounter: Secondary | ICD-10-CM

## 2010-10-14 DIAGNOSIS — M549 Dorsalgia, unspecified: Secondary | ICD-10-CM

## 2010-10-28 ENCOUNTER — Telehealth: Payer: Self-pay

## 2010-10-28 NOTE — Telephone Encounter (Signed)
Patient called lmovm requesting a personal call back from MD. Per patient, she had a fall and was told by ortho that she has a back fracture. Patient would like MD advice on pain meds, therapy and surgery etc..

## 2010-10-28 NOTE — Telephone Encounter (Signed)
Needs OV.  

## 2010-10-28 NOTE — Telephone Encounter (Signed)
Spoke w/pt - she is set up for next Monday at 2:00 - 30 min apt

## 2010-11-04 ENCOUNTER — Ambulatory Visit (INDEPENDENT_AMBULATORY_CARE_PROVIDER_SITE_OTHER): Payer: Medicare Other | Admitting: Internal Medicine

## 2010-11-04 ENCOUNTER — Encounter: Payer: Self-pay | Admitting: *Deleted

## 2010-11-04 ENCOUNTER — Telehealth: Payer: Self-pay | Admitting: *Deleted

## 2010-11-04 VITALS — BP 118/70 | HR 87 | Temp 97.5°F | Wt 213.0 lb

## 2010-11-04 DIAGNOSIS — M533 Sacrococcygeal disorders, not elsewhere classified: Secondary | ICD-10-CM

## 2010-11-04 DIAGNOSIS — G8929 Other chronic pain: Secondary | ICD-10-CM

## 2010-11-05 DIAGNOSIS — G8929 Other chronic pain: Secondary | ICD-10-CM | POA: Insufficient documentation

## 2010-11-05 MED ORDER — METHYLPREDNISOLONE ACETATE 80 MG/ML IJ SUSP: 40.0000 mg | Freq: Once | INTRAMUSCULAR | Status: DC

## 2010-11-05 NOTE — Assessment & Plan Note (Signed)
Patient had L-S spine films in July with possible T12 compression fracture. Her pain today is not at that level but is centered over the left S-I joint. There is point tenderness.  Plan - trigger point injection.  Indication: pain at the [facility] joint Consent: informed verbal consent - re: risk of bleeding, risk of infection. Anesthesia : 2% xylocain w/ epinephrine Site prep: betadine followed by alcohol  Medication: 40mg  depomedrol Using a 1.5" 23g needle, in a sterile fashion, the joint space was infiltrated. Bandaide applied Patient tolerated the procedure well. No complications from procedure Patient given routine wound care precautions: to call for pain, bleeding, fever.   She had immediate relief with injection of xylocaine. She is advised that the depomedrol will not kick in for 6-8 hours but will provide any-inflammatory treatment for 7 days.

## 2010-11-05 NOTE — Progress Notes (Signed)
  Subjective:    Patient ID: Barbara Bradshaw, female    DOB: Aug 19, 1945, 65 y.o.   MRN: [id number]  HPI Mrs. Mcgraw presents for follow-up of back pain. In June she had a thoracic compression fracture T12. She was seen by Ortho. Had MRI at that time. Vertebroplasty was suggested but she never acted on this. She presents today for pain in the left low back which she thinks is the Comp fracture. She has had no motor weakness in the legs, no loss of sensation, no radiation of pain to the legs. Her pain is worse with standing. For pain she is taking APAP w/ diphenhydramine. She has been able to do all ADLs.   I have reviewed the patient's medical history in detail and updated the computerized patient record.    Review of Systems System review is negative for any constitutional, cardiac, pulmonary, GI or neuro symptoms or complaints     Objective:   Physical Exam Vitals - reviewed and normal except for weight Gen'l - overweight white woman in no acute distress Chest - CTAP Cor - RRR MSK - no CVAT tenderness, Able to stand w/o assist, walk, flex at the waist. She has point tenderness over the left SI joint. Neuro - non focal exam        Assessment & Plan:

## 2010-11-11 ENCOUNTER — Telehealth: Payer: Self-pay | Admitting: *Deleted

## 2010-11-11 NOTE — Telephone Encounter (Signed)
Pt c/o chest congestion, productive cough, sinus drainage & chills x 6 days. No SOB, robitussin has helped w/cough - she is scheduled for OV tomorrow for eval.

## 2010-11-12 ENCOUNTER — Other Ambulatory Visit (INDEPENDENT_AMBULATORY_CARE_PROVIDER_SITE_OTHER): Payer: Medicare Other

## 2010-11-12 ENCOUNTER — Ambulatory Visit (INDEPENDENT_AMBULATORY_CARE_PROVIDER_SITE_OTHER): Payer: Medicare Other | Admitting: Internal Medicine

## 2010-11-12 DIAGNOSIS — IMO0001 Reserved for inherently not codable concepts without codable children: Secondary | ICD-10-CM

## 2010-11-12 DIAGNOSIS — E785 Hyperlipidemia, unspecified: Secondary | ICD-10-CM

## 2010-11-12 DIAGNOSIS — J00 Acute nasopharyngitis [common cold]: Secondary | ICD-10-CM

## 2010-11-12 DIAGNOSIS — I10 Essential (primary) hypertension: Secondary | ICD-10-CM

## 2010-11-12 DIAGNOSIS — E119 Type 2 diabetes mellitus without complications: Secondary | ICD-10-CM

## 2010-11-12 DIAGNOSIS — R1013 Epigastric pain: Secondary | ICD-10-CM

## 2010-11-12 DIAGNOSIS — J069 Acute upper respiratory infection, unspecified: Secondary | ICD-10-CM

## 2010-11-12 DIAGNOSIS — K3189 Other diseases of stomach and duodenum: Secondary | ICD-10-CM

## 2010-11-12 LAB — COMPREHENSIVE METABOLIC PANEL
ALT: 16 U/L (ref 0–35)
AST: 17 U/L (ref 0–37)
CO2: 31 mEq/L (ref 19–32)
Chloride: 103 mEq/L (ref 96–112)
Creatinine, Ser: 0.8 mg/dL (ref 0.4–1.2)
Sodium: 142 mEq/L (ref 135–145)
Total Bilirubin: 0.4 mg/dL (ref 0.3–1.2)
Total Protein: 7.6 g/dL (ref 6.0–8.3)

## 2010-11-12 LAB — LIPID PANEL
LDL Cholesterol: 27 mg/dL (ref 0–99)
Total CHOL/HDL Ratio: 2
Triglycerides: 103 mg/dL (ref 0.0–149.0)

## 2010-11-12 LAB — CBC WITH DIFFERENTIAL/PLATELET
Basophils Relative: 0.4 % (ref 0.0–3.0)
Eosinophils Absolute: 0.2 10*3/uL (ref 0.0–0.7)
Eosinophils Relative: 1.7 % (ref 0.0–5.0)
HCT: 42.2 % (ref 36.0–46.0)
Hemoglobin: 14.1 g/dL (ref 12.0–15.0)
MCHC: 33.5 g/dL (ref 30.0–36.0)
MCV: 87.3 fl (ref 78.0–100.0)
Monocytes Absolute: 0.8 10*3/uL (ref 0.1–1.0)
Neutro Abs: 6 10*3/uL (ref 1.4–7.7)
Neutrophils Relative %: 66 % (ref 43.0–77.0)
RBC: 4.84 Mil/uL (ref 3.87–5.11)
WBC: 9.1 10*3/uL (ref 4.5–10.5)

## 2010-11-12 LAB — HEPATIC FUNCTION PANEL
AST: 17 U/L (ref 0–37)
Albumin: 4.4 g/dL (ref 3.5–5.2)
Alkaline Phosphatase: 104 U/L (ref 39–117)
Total Protein: 7.6 g/dL (ref 6.0–8.3)

## 2010-11-12 LAB — HEMOGLOBIN A1C: Hgb A1c MFr Bld: 6.6 % — ABNORMAL HIGH (ref 4.6–6.5)

## 2010-11-12 NOTE — Patient Instructions (Signed)
Cough and cold - no evidence of a bacterial infection, thus no need for antibiotics (risk out weighs benefit). Plan - supportive care using tylenol for aches, pains or fevers; hydrate, vitamin C, rest, robitussin DM for cough.  Swallowing trouble - may be an esophageal stricture due to reflux. Plan Barium swallow vs EGD. Be sure to have small bites and chew well. Sips of liquid after bites. Try to get by with naproxen. Start taking either prilosec OTC or otc zantac, etc.  Pain at left SI joint - seems improved.   Diabetes- last A1C 6.3% in October '11. You are due for lab and we can get that today. Last lipid panel also in October - we can check today.

## 2010-11-13 NOTE — Progress Notes (Signed)
  Subjective:    Patient ID: Barbara Bradshaw, female    DOB: 04-28-1945, 65 y.o.   MRN: [id number]  HPI Barbara Bradshaw was seen redently for pain at the left SI joint. She responded nicley to a trigger point injection - reports continued improvement in pain reductions.  She presents today with a cough that is productive of a yellow sputum. There has been no hemoptysis. She adits to chills and sweats but has not documented a fever. She denies SOB, DOE, N/V. She has had several paroxysms of coughing that lead to gagging. She does have mild white/clea rhinorrhea. She does get a good response to robitussin DM.  As a separate problem she reports a tightness in her chest when eating and that food will "get stuck" mid-way down with swallow that will require regurgitation. This has happened several times. She does have a history of reflux.  Past Medical History  Diagnosis Date  . Diabetes mellitus, type 2   . Hypertension   . Rosacea   . Nephrolithiasis   . Hyperlipidemia    Past Surgical History  Procedure Date  . Tubal ligation   . Right arm fx with orif    No family history on file. History   Social History  . Marital Status: Married    Spouse Name: N/A    Number of Children: N/A  . Years of Education: N/A   Occupational History  . Not on file.   Social History Main Topics  . Smoking status: Not on file  . Smokeless tobacco: Not on file  . Alcohol Use: Not on file  . Drug Use: Not on file  . Sexually Active: Not on file   Other Topics Concern  . Not on file   Social History Narrative   HSG, 2 years collegeMarried [id number] sons- 1968, 1974, [id number] grandchildrenRetired (disability)Lives with SO- I- ADLS        Review of Systems System review is negative for any constitutional, cardiac, pulmonary, GI or neuro symptoms or complaints      Objective:   Physical Exam Vitals reviewed - no fever, good O2 sat. Gen'l - overweight white female in no distress HEENT -[place]/AT, no  tenderness to percussion over the frontal/maxillary sinus, throat clear Chest - normal respirations, no rales or wheezes. Abdomen - soft        Assessment & Plan:  URI-viral  No antibiotic indicated.  Plan - supportive care: continue robitussin DM, add zyrtec, fluids , APAP

## 2010-11-14 NOTE — Assessment & Plan Note (Signed)
Due for lab follow up.  Addendum: HDL 51.3, LDL 27

## 2010-11-14 NOTE — Assessment & Plan Note (Signed)
Due for A1C  Addendum; A1C = 6.6%  Good control.

## 2010-11-14 NOTE — Assessment & Plan Note (Signed)
BP Readings from Last 3 Encounters:  11/12/10 122/70  11/04/10 118/70  01/14/10 118/62   Good control

## 2010-11-14 NOTE — Assessment & Plan Note (Signed)
Patient reporting mild dysphagia with known acid reflux raising concern for esophageal stricture.  Paln - stop NSAIDs           Take zantac bid           For continued symptoms will need GI referral for possible EGD w/ dilatation

## 2010-11-18 ENCOUNTER — Encounter: Payer: Self-pay | Admitting: Internal Medicine

## 2010-11-25 NOTE — Telephone Encounter (Signed)
error 

## 2011-01-17 ENCOUNTER — Encounter: Payer: Medicare Other | Admitting: Internal Medicine

## 2011-01-24 ENCOUNTER — Encounter: Payer: Medicare Other | Admitting: Internal Medicine

## 2011-01-30 ENCOUNTER — Telehealth: Payer: Self-pay | Admitting: *Deleted

## 2011-01-30 MED ORDER — GLIPIZIDE ER 10 MG PO TB24: 10.0000 mg | ORAL_TABLET | Freq: Every day | ORAL | Status: DC

## 2011-01-30 NOTE — Telephone Encounter (Signed)
Pt.notified

## 2011-01-30 NOTE — Telephone Encounter (Signed)
Pt left message requesting refill of glucotrol xl be sent to [facility] for a 90 day supply. Refill sent #90 x no refills. Pt has f/u in December.

## 2011-02-17 ENCOUNTER — Other Ambulatory Visit: Payer: Self-pay | Admitting: *Deleted

## 2011-02-17 MED ORDER — PAROXETINE HCL 20 MG PO TABS: 20.0000 mg | ORAL_TABLET | Freq: Every day | ORAL | Status: DC

## 2011-02-17 MED ORDER — FUROSEMIDE 40 MG PO TABS: 40.0000 mg | ORAL_TABLET | Freq: Every day | ORAL | Status: DC

## 2011-03-24 ENCOUNTER — Telehealth: Payer: Self-pay

## 2011-03-24 MED ORDER — METFORMIN HCL 1000 MG PO TABS: ORAL_TABLET | ORAL | Status: DC

## 2011-03-24 NOTE — Telephone Encounter (Signed)
Med refill

## 2011-03-27 ENCOUNTER — Other Ambulatory Visit (INDEPENDENT_AMBULATORY_CARE_PROVIDER_SITE_OTHER): Payer: Medicare Other

## 2011-03-27 ENCOUNTER — Ambulatory Visit (INDEPENDENT_AMBULATORY_CARE_PROVIDER_SITE_OTHER): Payer: Medicare Other | Admitting: Internal Medicine

## 2011-03-27 ENCOUNTER — Encounter: Payer: Self-pay | Admitting: Internal Medicine

## 2011-03-27 VITALS — BP 118/64 | HR 97 | Temp 98.0°F | Wt 206.0 lb

## 2011-03-27 DIAGNOSIS — Z0000 Encounter for general adult medical examination without abnormal findings: Secondary | ICD-10-CM

## 2011-03-27 DIAGNOSIS — E785 Hyperlipidemia, unspecified: Secondary | ICD-10-CM

## 2011-03-27 DIAGNOSIS — Z1211 Encounter for screening for malignant neoplasm of colon: Secondary | ICD-10-CM

## 2011-03-27 DIAGNOSIS — I10 Essential (primary) hypertension: Secondary | ICD-10-CM

## 2011-03-27 DIAGNOSIS — E119 Type 2 diabetes mellitus without complications: Secondary | ICD-10-CM

## 2011-03-27 DIAGNOSIS — K3189 Other diseases of stomach and duodenum: Secondary | ICD-10-CM

## 2011-03-27 DIAGNOSIS — R1013 Epigastric pain: Secondary | ICD-10-CM

## 2011-03-27 LAB — LIPID PANEL
Cholesterol: 114 mg/dL (ref 0–200)
LDL Cholesterol: 38 mg/dL (ref 0–99)
Total CHOL/HDL Ratio: 3
Triglycerides: 158 mg/dL — ABNORMAL HIGH (ref 0.0–149.0)
VLDL: 31.6 mg/dL (ref 0.0–40.0)

## 2011-03-27 LAB — COMPREHENSIVE METABOLIC PANEL
ALT: 24 U/L (ref 0–35)
AST: 27 U/L (ref 0–37)
Alkaline Phosphatase: 76 U/L (ref 39–117)
BUN: 15 mg/dL (ref 6–23)
Chloride: 100 mEq/L (ref 96–112)
Creatinine, Ser: 1 mg/dL (ref 0.4–1.2)
Potassium: 4.8 mEq/L (ref 3.5–5.1)

## 2011-03-27 LAB — HEPATIC FUNCTION PANEL
ALT: 24 U/L (ref 0–35)
Albumin: 4.5 g/dL (ref 3.5–5.2)
Alkaline Phosphatase: 76 U/L (ref 39–117)
Bilirubin, Direct: 0.1 mg/dL (ref 0.0–0.3)
Total Protein: 7.6 g/dL (ref 6.0–8.3)

## 2011-03-27 NOTE — Progress Notes (Signed)
Subjective:    Patient ID: Barbara Bradshaw, female    DOB: 02-15-1946, 65 y.o.   MRN: [id number]  HPI The patient is here for annual Medicare wellness examination and management of other chronic and acute problems.  She reports that she has persistent cough, rhinorrhea, congestion but no fever, no purulence.   The risk factors are reflected in the social history.  The roster of all physicians providing medical care to patient - is listed in the Snapshot section of the chart.  Activities of daily living:  The patient is 100% inedpendent in all ADLs: dressing, toileting, feeding as well as independent mobility  Home safety : The patient has smoke detectors in the home. They wear seatbelts. No firearms at home. There is no violence in the home.   There is no risks for hepatitis, STDs or HIV. There is no   history of blood transfusion. They have no travel history to infectious disease endemic areas of the world.  The patient has seen their dentist in the last six month.going to the [facility] in February for full extraction.  They have not seen their eye doctor in the last year. They deny any hearing difficulty and have not had audiologic testing in the last year.  They do not  have excessive sun exposure. Discussed the need for sun protection: hats, long sleeves and use of sunscreen if there is significant sun exposure.   Diet: the importance of a healthy diet is discussed. They do have a healthy diet.  The patient has no regular exercise program.  The benefits of regular aerobic exercise were discussed.  Depression screen: there are no signs or vegative symptoms of depression- irritability, change in appetite, anhedonia, sadness/tearfullness.  Cognitive assessment: the patient manages all their financial and personal affairs and is actively engaged. They could relate day,date,year and events; recalled 3/3 objects at 3 minutes; performed clock-face test normally.  The following  portions of the patient's history were reviewed and updated as appropriate: allergies, current medications, past family history, past medical history,  past surgical history, past social history  and problem list.  Vision, hearing, body mass index were assessed and reviewed.   During the course of the visit the patient was educated and counseled about appropriate screening and preventive services including : fall prevention , diabetes screening, nutrition counseling, colorectal cancer screening, and recommended immunizations.  Past Medical History  Diagnosis Date  . Diabetes mellitus, type 2   . Hypertension   . Rosacea   . Nephrolithiasis   . Hyperlipidemia    Past Surgical History  Procedure Date  . Tubal ligation   . Right arm fx with orif    Family History  Problem Relation Age of Onset  . Dementia Mother   . Cancer Mother     breast  . Heart disease Mother   . Hypertension Mother   . Diabetes Father    History   Social History  . Marital Status: Married    Spouse Name: [name]    Number of Children: 3  . Years of Education: 14   Occupational History  . retired    Social History Main Topics  . Smoking status: Never Smoker   . Smokeless tobacco: Never Used  . Alcohol Use: No  . Drug Use: No  . Sexually Active: Not Currently   Other Topics Concern  . Not on file   Social History Narrative   HSG, 2 years collegeMarried 1966. 3 sons- 1968,  1974, 1976. 3 grandchildren. Retired (disability)Lives with SO in their own home. ACP - no prolonged support on artificial means.         Review of Systems Constitutional:  Negative for fever, chills, activity change and unexpected weight change.  HEENT:  Negative for hearing loss, ear pain, congestion, neck stiffness and postnasal drip. Negative for sore throat or swallowing problems. Negative for dental complaints.   Eyes: Negative for vision loss or change in visual acuity.  Respiratory: Negative for chest tightness  and wheezing. Negative for DOE.   Cardiovascular: Negative for chest pain or palpitations. No decreased exercise tolerance Gastrointestinal: No change in bowel habit. No bloating or gas. No reflux or indigestion Genitourinary: Negative for urgency, frequency, flank pain and difficulty urinating.  Musculoskeletal: Negative for myalgias, back pain, arthralgias and gait problem.  Neurological: Negative for dizziness, tremors, weakness and headaches.  Hematological: Negative for adenopathy.  Psychiatric/Behavioral: Negative for behavioral problems and dysphoric mood.       Objective:   Physical Exam Vitals reviewed - normal Gen'l - Overweight white woman in no distress HEENT- [place]/AT, Right EAC with cerumen w/o impaction TM nl; Left EAC/TM normal. Missing many teeth, no buccal or palatal lesions, posterior pharynx normal. Neck - supple, w/o thyromegaly Nodes - negative cervical, supraclavicular, axillary region Chest - mild kyphosis, no CVAT Pulm - normal respirations and breath sounds Breast exam - skin normal, nipples w/o discharge, no fixed mass or lesion, mild fibrocystic changes noted Cor - 2+ radial pulse, 1+ DP pulse; RRR w/o murmur, rub, gallop; no JVD; no carotid bruits Abd- obese, BS+ x 4, No HSM Pelvic/rectal exams deferred Ext - no C/C/E, no overt deformity small, medium or large joints Neuro - A&O x 3, CN II-XII normal, DTRs normal,  Derm - clear  Lab Results  Component Value Date   WBC 9.1 11/12/2010   HGB 14.1 11/12/2010   HCT 42.2 11/12/2010   PLT 166.0 11/12/2010   GLUCOSE 156* 03/27/2011   CHOL 114 03/27/2011   TRIG 158.0* 03/27/2011   HDL 44.40 03/27/2011   LDLDIRECT 40.1 03/09/2007   LDLCALC 38 03/27/2011   ALT 24 03/27/2011   ALT 24 03/27/2011   AST 27 03/27/2011   AST 27 03/27/2011   NA 139 03/27/2011   K 4.8 03/27/2011   CL 100 03/27/2011   CREATININE 1.0 03/27/2011   BUN 15 03/27/2011   CO2 29 03/27/2011   TSH 0.81 01/14/2010   HGBA1C 7.2* 03/27/2011           Assessment & Plan:

## 2011-03-27 NOTE — Patient Instructions (Signed)
Normal exam today - no evidence of any bacterial infection. Suspect residual cough or allergy type symptoms.  Plan - otc claritin for runny nose, sudafed (generic) 30 mg twice a day for congestion, Robitussin DM sugar free or the equivalent every 4 hours for cough.            No indication for antibiotic.  Allergic Rhinitis Allergic rhinitis is when the mucous membranes in the nose respond to allergens. Allergens are particles in the air that cause your body to have an allergic reaction. This causes you to release allergic antibodies. Through a chain of events, these eventually cause you to release histamine into the blood stream (hence the use of antihistamines). Although meant to be protective to the body, it is this release that causes your discomfort, such as frequent sneezing, congestion and an itchy runny nose.   CAUSES   The pollen allergens may come from grasses, trees, and weeds. This is seasonal allergic rhinitis, or "hay fever." Other allergens cause year-round allergic rhinitis (perennial allergic rhinitis) such as house dust mite allergen, pet dander and mold spores.   SYMPTOMS    Nasal stuffiness (congestion).     Runny, itchy nose with sneezing and tearing of the eyes.     There is often an itching of the mouth, eyes and ears.  It cannot be cured, but it can be controlled with medications. DIAGNOSIS   If you are unable to determine the offending allergen, skin or blood testing may find it. TREATMENT    Avoid the allergen.     Medications and allergy shots (immunotherapy) can help.     Hay fever may often be treated with antihistamines in pill or nasal spray forms. Antihistamines block the effects of histamine. There are over-the-counter medicines that may help with nasal congestion and swelling around the eyes. Check with your caregiver before taking or giving this medicine.  If the treatment above does not work, there are many new medications your caregiver can prescribe.  Stronger medications may be used if initial measures are ineffective. Desensitizing injections can be used if medications and avoidance fails. Desensitization is when a patient is given ongoing shots until the body becomes less sensitive to the allergen. Make sure you follow up with your caregiver if problems continue. SEEK MEDICAL CARE IF:    You develop fever (more than 100.5 F (38.1 C).     You develop a cough that does not stop easily (persistent).     You have shortness of breath.     You start wheezing.     Symptoms interfere with normal daily activities.  Document Released: 12/10/2000 Document Revised: 11/27/2010 Document Reviewed: 06/21/2008 [facility] Patient Information 2012 [place], [place].

## 2011-03-28 DIAGNOSIS — Z0000 Encounter for general adult medical examination without abnormal findings: Secondary | ICD-10-CM | POA: Insufficient documentation

## 2011-03-28 NOTE — Assessment & Plan Note (Signed)
Interval medical history is unremarkable. Physical exam normal except for weight. Lab results are in normal limits. She is current with mammography. She is a candidate for colorectal cancer screening - colonoscopy and is referred to GI. Immunizations are up to date.  In summary - a very nice woman who is medically stable and doing pretty well. Weight loss is her biggest challenge. She will return as needed.

## 2011-03-28 NOTE — Assessment & Plan Note (Signed)
Discussed this as a major risk factor in regard to heart disease, diabetes, hypertension  Plan - weight management: smart food choices with no sugar, low carb, low fat; PORTION SIZE CONTROL; regular exercise.            Goal - to loose 18 lbs over the next year = 1.5 lbs per month

## 2011-03-28 NOTE — Assessment & Plan Note (Signed)
No active complaint at today's visit. 

## 2011-03-28 NOTE — Assessment & Plan Note (Signed)
BP Readings from Last 3 Encounters:  03/27/11 118/64  11/12/10 122/70  11/04/10 118/70   Great control on present medications, tolerated well.  Plan - continue present regimen

## 2011-03-28 NOTE — Assessment & Plan Note (Signed)
Excellent control with LDL better than goal of 100 or less  Plan - continue a low fat diet - no need for medical intervention

## 2011-03-28 NOTE — Assessment & Plan Note (Signed)
A1C 7.2% - close to goal of 7% or less  Plan - continue present medications           Strict diet: no sugar, low carb, calorie restriction           Exercise 30-45 minutes 3 times a week: walking is good

## 2011-05-01 ENCOUNTER — Other Ambulatory Visit: Payer: Self-pay | Admitting: *Deleted

## 2011-05-01 MED ORDER — FUROSEMIDE 40 MG PO TABS: 40.0000 mg | ORAL_TABLET | Freq: Every day | ORAL | Status: DC

## 2011-05-01 MED ORDER — GLIPIZIDE ER 10 MG PO TB24: 10.0000 mg | ORAL_TABLET | Freq: Every day | ORAL | Status: DC

## 2011-05-01 MED ORDER — QUINAPRIL HCL 10 MG PO TABS: 10.0000 mg | ORAL_TABLET | Freq: Every day | ORAL | Status: DC

## 2011-05-01 MED ORDER — PIOGLITAZONE HCL 30 MG PO TABS: 30.0000 mg | ORAL_TABLET | Freq: Every day | ORAL | Status: DC

## 2011-05-01 MED ORDER — PAROXETINE HCL 20 MG PO TABS: 20.0000 mg | ORAL_TABLET | Freq: Every day | ORAL | Status: DC

## 2011-05-01 MED ORDER — METFORMIN HCL 1000 MG PO TABS: ORAL_TABLET | ORAL | Status: DC

## 2011-05-01 NOTE — Telephone Encounter (Signed)
Pt left message requesting refills for all her medications to be sent to [facility] on [address] (was here in December for annual exam). Pt informed rx's sent to pharmacy.

## 2011-05-05 ENCOUNTER — Other Ambulatory Visit: Payer: Self-pay

## 2011-05-19 ENCOUNTER — Encounter: Payer: Self-pay | Admitting: Gastroenterology

## 2011-05-19 ENCOUNTER — Other Ambulatory Visit: Payer: Self-pay | Admitting: Internal Medicine

## 2011-05-26 ENCOUNTER — Telehealth: Payer: Self-pay | Admitting: *Deleted

## 2011-05-26 MED ORDER — ROSUVASTATIN CALCIUM 10 MG PO TABS: 10.0000 mg | ORAL_TABLET | Freq: Every day | ORAL | Status: DC

## 2011-05-26 NOTE — Telephone Encounter (Signed)
Patient would like call back to discuss medications. Pharmacy was informed patient didn't take Crestor from EMR records? Medication not transferred from [facility], but is active in patient med list. Apologized to patient for miscommunication & sent Rx to pharmacy.

## 2011-06-02 ENCOUNTER — Ambulatory Visit (AMBULATORY_SURGERY_CENTER): Payer: Medicare Other | Admitting: *Deleted

## 2011-06-02 ENCOUNTER — Encounter: Payer: Self-pay | Admitting: Gastroenterology

## 2011-06-02 VITALS — Ht 64.0 in | Wt 207.8 lb

## 2011-06-02 DIAGNOSIS — Z1211 Encounter for screening for malignant neoplasm of colon: Secondary | ICD-10-CM

## 2011-06-02 MED ORDER — PEG-KCL-NACL-NASULF-NA ASC-C 100 G PO SOLR: ORAL | Status: DC

## 2011-06-16 ENCOUNTER — Ambulatory Visit (AMBULATORY_SURGERY_CENTER): Payer: Medicare Other | Admitting: Gastroenterology

## 2011-06-16 ENCOUNTER — Encounter: Payer: Self-pay | Admitting: Gastroenterology

## 2011-06-16 VITALS — BP 131/81 | HR 83 | Temp 98.8°F | Resp 20 | Ht 64.0 in | Wt 207.0 lb

## 2011-06-16 DIAGNOSIS — K219 Gastro-esophageal reflux disease without esophagitis: Secondary | ICD-10-CM

## 2011-06-16 DIAGNOSIS — Z1211 Encounter for screening for malignant neoplasm of colon: Secondary | ICD-10-CM

## 2011-06-16 DIAGNOSIS — K5730 Diverticulosis of large intestine without perforation or abscess without bleeding: Secondary | ICD-10-CM

## 2011-06-16 MED ORDER — SODIUM CHLORIDE 0.9 % IV SOLN: 500.0000 mL | INTRAVENOUS | Status: DC

## 2011-06-16 MED ORDER — SUCRALFATE 1 G PO TABS: 1.0000 g | ORAL_TABLET | Freq: Four times a day (QID) | ORAL | Status: DC

## 2011-06-16 NOTE — Patient Instructions (Signed)
YOU HAD AN ENDOSCOPIC PROCEDURE TODAY AT THE [facility]: Refer to the procedure report that was given to you for any specific questions about what was found during the examination.  If the procedure report does not answer your questions, please call your gastroenterologist to clarify.  If you requested that your care partner not be given the details of your procedure findings, then the procedure report has been included in a sealed envelope for you to review at your convenience later.  YOU SHOULD EXPECT: Some feelings of bloating in the abdomen. Passage of more gas than usual.  Walking can help get rid of the air that was put into your GI tract during the procedure and reduce the bloating. If you had a lower endoscopy (such as a colonoscopy or flexible sigmoidoscopy) you may notice spotting of blood in your stool or on the toilet paper. If you underwent a bowel prep for your procedure, then you may not have a normal bowel movement for a few days.  DIET: Your first meal following the procedure should be a light meal and then it is ok to progress to your normal diet.  A half-sandwich or bowl of soup is an example of a good first meal.  Heavy or fried foods are harder to digest and may make you feel nauseous or bloated.  Likewise meals heavy in dairy and vegetables can cause extra gas to form and this can also increase the bloating.  Drink plenty of fluids but you should avoid alcoholic beverages for 24 hours.  ACTIVITY: Your care partner should take you home directly after the procedure.  You should plan to take it easy, moving slowly for the rest of the day.  You can resume normal activity the day after the procedure however you should NOT DRIVE or use heavy machinery for 24 hours (because of the sedation medicines used during the test).    SYMPTOMS TO REPORT IMMEDIATELY: A gastroenterologist can be reached at any hour.  During normal business hours, 8:30 AM to 5:00 PM Monday through Friday,  call [phone].  After hours and on weekends, please call the GI answering service at [phone] who will take a message and have the physician on call contact you.   Following lower endoscopy (colonoscopy or flexible sigmoidoscopy):  Excessive amounts of blood in the stool  Significant tenderness or worsening of abdominal pains  Swelling of the abdomen that is new, acute  Fever of 100F or higher  Following upper endoscopy (EGD)  Vomiting of blood or coffee ground material  New chest pain or pain under the shoulder blades  Painful or persistently difficult swallowing  New shortness of breath  Fever of 100F or higher  Black, tarry-looking stools  FOLLOW UP: If any biopsies were taken you will be contacted by phone or by letter within the next 1-3 weeks.  Call your gastroenterologist if you have not heard about the biopsies in 3 weeks.  Our staff will call the home number listed on your records the next business day following your procedure to check on you and address any questions or concerns that you may have at that time regarding the information given to you following your procedure. This is a courtesy call and so if there is no answer at the home number and we have not heard from you through the emergency physician on call, we will assume that you have returned to your regular daily activities without incident.  SIGNATURES/CONFIDENTIALITY: You and/or your care   partner have signed paperwork which will be entered into your electronic medical record.  These signatures attest to the fact that that the information above on your After Visit Summary has been reviewed and is understood.  Full responsibility of the confidentiality of this discharge information lies with you and/or your care-partner.  

## 2011-06-16 NOTE — Op Note (Signed)
[facility] [address] [place], [place]  [address]  COLONOSCOPY PROCEDURE REPORT  PATIENT:  Barbara Bradshaw, Barbara Bradshaw  MR#:  [id number] BIRTHDATE:  May 19, 1945, 65 yrs. old  GENDER:  female ENDOSCOPIST:  Vania Rea. Jarold Motto, MD, [facility] REF. BY:  Rosalyn Gess. Norins, M.D. PROCEDURE DATE:  06/16/2011 PROCEDURE:  Average-risk screening colonoscopy G0121 ASA CLASS:  Class III INDICATIONS:  Routine Risk Screening MEDICATIONS:   propofol (Diprivan) 150 mg IV  DESCRIPTION OF PROCEDURE:   After the risks and benefits and of the procedure were explained, informed consent was obtained. Digital rectal exam was performed and revealed no abnormalities. The LB [id number]-H180AL [id number] endoscope was introduced through the anus and advanced to the cecum, which was identified by both the appendix and ileocecal valve.  The quality of the prep was excellent, using MoviPrep.  The instrument was then slowly withdrawn as the colon was fully examined. <<PROCEDUREIMAGES>>  FINDINGS:  There were mild diverticular changes in left colon. diverticulosis was found.  No polyps or cancers were seen. Retroflexed views in the rectum revealed internal hemorrhoids. The scope was then withdrawn from the patient and the procedure completed.  COMPLICATIONS:  None ENDOSCOPIC IMPRESSION: 1) Diverticulosis,mild,left sided diverticulosis 2) No polyps or cancers 3) Internal hemorrhoids RECOMMENDATIONS: 1) High fiber diet 2) Continue current colorectal screening recommendations for "routine risk" patients with a repeat colonoscopy in 10 years.  REPEAT EXAM:  No  ______________________________ Vania Rea. Jarold Motto, MD, [facility]  CC:  n. eSIGNED:   Vania Rea. Patterson at 06/16/2011 03:37 PM  Abbey Chatters, [phone]

## 2011-06-16 NOTE — Progress Notes (Signed)
Patient did not experience any of the following events: a burn prior to discharge; a fall within the facility; wrong site/side/patient/procedure/implant event; or a hospital transfer or hospital admission upon discharge from the facility. (G8907) Patient did not have preoperative order for IV antibiotic SSI prophylaxis. (G8918)  

## 2011-06-17 ENCOUNTER — Other Ambulatory Visit: Payer: Self-pay | Admitting: *Deleted

## 2011-06-17 ENCOUNTER — Telehealth: Payer: Self-pay | Admitting: *Deleted

## 2011-06-17 MED ORDER — QUINAPRIL HCL 10 MG PO TABS: 10.0000 mg | ORAL_TABLET | Freq: Every day | ORAL | Status: DC

## 2011-06-17 NOTE — Telephone Encounter (Signed)
  Follow up Call-  Call back number 06/16/2011  Post procedure Call Back phone  # [phone]  Permission to leave phone message Yes     Patient questions:  Do you have a fever, pain , or abdominal swelling? no   Have you tolerated food without any problems? yes  Have you been able to return to your normal activities? yes  Do you have any questions about your discharge instructions: Diet   no Medications  no Follow up visit  no  Do you have questions or concerns about your Care? no  Actions: * If pain score is 4 or above:

## 2011-06-19 ENCOUNTER — Telehealth: Payer: Self-pay

## 2011-06-19 NOTE — Telephone Encounter (Signed)
Reviewed colonoscopy report - diverticulosis with no mention of diverticulitis.  Reviewed post -procedure follow up call: no report of pain, fever or other symtoms.  No evidence of diverticulitis or need for antibiotics. May call Dr. Jarold Motto or may have OV today for evaluation if she has abdominal pain.

## 2011-06-19 NOTE — Telephone Encounter (Signed)
Pt advised of MD's recommendation and understood.  

## 2011-06-19 NOTE — Telephone Encounter (Signed)
Pt called stating that she was told by Dr Jarold Motto that diverticulitis was seen on her colonoscopy and that she should probably take an ABX. She was not prescribed an ABX by that office and is requesting one, if necessary from [facility], please advise.

## 2011-07-08 ENCOUNTER — Other Ambulatory Visit: Payer: Self-pay | Admitting: Internal Medicine

## 2011-07-09 ENCOUNTER — Encounter: Payer: Self-pay | Admitting: *Deleted

## 2011-07-10 ENCOUNTER — Telehealth: Payer: Self-pay | Admitting: *Deleted

## 2011-07-10 NOTE — Telephone Encounter (Signed)
Received letter from patient stating that a new letter was needed for excusing her from jury duty dated 04.08.13, along with documentation of disability explaining why it prevents patient from serving. Letter completed by [facility], along with printout of patient's last OV note. Called patient to inform her that requirement needed for her request are ready & available for p/u at our office, along with a medical release form that need her signature to be able to release her medical record information for the purpose of excusing pt from jury duty as requested. Paperwork will be at front desk/SLS

## 2011-10-13 ENCOUNTER — Telehealth: Payer: Self-pay | Admitting: Internal Medicine

## 2011-10-13 NOTE — Telephone Encounter (Signed)
Caller: Barbara Bradshaw/Patient; PCP: Illene Regulus; CB#: [phone]; ; ; Call regarding Shaking Hands and Feet;  Onset- getting worse for months per pt. Pt is having shaking of her hands and feet. She is concerned because she has positive family hx for Parkinson's disease. Emergent s/s of Tremors protocol r/o. Pt to see provider within 72hrs. Appt scheduled for tomorrow 10/14/11 at 4:15pm with Dr. Debby Bud.

## 2011-10-14 ENCOUNTER — Ambulatory Visit: Payer: Medicare Other | Admitting: Internal Medicine

## 2011-10-21 ENCOUNTER — Encounter: Payer: Self-pay | Admitting: Internal Medicine

## 2011-10-21 ENCOUNTER — Other Ambulatory Visit (INDEPENDENT_AMBULATORY_CARE_PROVIDER_SITE_OTHER): Payer: Medicare Other

## 2011-10-21 ENCOUNTER — Ambulatory Visit (INDEPENDENT_AMBULATORY_CARE_PROVIDER_SITE_OTHER): Payer: Medicare Other | Admitting: Internal Medicine

## 2011-10-21 VITALS — BP 112/63 | HR 83 | Temp 98.3°F | Resp 16 | Wt 198.0 lb

## 2011-10-21 DIAGNOSIS — G250 Essential tremor: Secondary | ICD-10-CM

## 2011-10-21 DIAGNOSIS — I10 Essential (primary) hypertension: Secondary | ICD-10-CM

## 2011-10-21 DIAGNOSIS — Z79899 Other long term (current) drug therapy: Secondary | ICD-10-CM

## 2011-10-21 DIAGNOSIS — E119 Type 2 diabetes mellitus without complications: Secondary | ICD-10-CM

## 2011-10-21 DIAGNOSIS — E66812 Obesity, class 2: Secondary | ICD-10-CM

## 2011-10-21 DIAGNOSIS — G20 Parkinson's disease: Secondary | ICD-10-CM | POA: Insufficient documentation

## 2011-10-21 DIAGNOSIS — G252 Other specified forms of tremor: Secondary | ICD-10-CM

## 2011-10-21 DIAGNOSIS — E785 Hyperlipidemia, unspecified: Secondary | ICD-10-CM

## 2011-10-21 DIAGNOSIS — E669 Obesity, unspecified: Secondary | ICD-10-CM

## 2011-10-21 LAB — CBC WITH DIFFERENTIAL/PLATELET
Basophils Relative: 0.4 % (ref 0.0–3.0)
Eosinophils Relative: 2.8 % (ref 0.0–5.0)
HCT: 39.8 % (ref 36.0–46.0)
Hemoglobin: 13.1 g/dL (ref 12.0–15.0)
Lymphs Abs: 2.2 10*3/uL (ref 0.7–4.0)
Monocytes Relative: 6.7 % (ref 3.0–12.0)
Neutro Abs: 6 10*3/uL (ref 1.4–7.7)
RBC: 4.55 Mil/uL (ref 3.87–5.11)
WBC: 9.1 10*3/uL (ref 4.5–10.5)

## 2011-10-21 LAB — HEMOGLOBIN A1C: Hgb A1c MFr Bld: 7.2 % — ABNORMAL HIGH (ref 4.6–6.5)

## 2011-10-21 NOTE — Progress Notes (Signed)
Subjective:    Patient ID: Barbara Bradshaw, female    DOB: 12-13-1945, 66 y.o.   MRN: [id number]  HPI Barbara Bradshaw presents for evaluation of tremor affecting both hands and the right leg. This is intermittent and is associated with activity. She is dropping things, cannot write. She can feel her leg shake with walking,  Climbing stairs is very difficult due to tremor and weakness. This has been a progressive problem. No paresthesia, no focal weakness. She is very fatigued with generalized weakness. She sleeps poorly but may be able to sleep during the day. NO slurred, no expressive aphasia. No fever, no chills.   She has been loosing weight - 15lb over the last two months. She has had upper teeth extracted and has a temporary denture, which may account for decreased calorie intake.   Past Medical History  Diagnosis Date  . Diabetes mellitus, type 2   . Hypertension   . Rosacea   . Nephrolithiasis   . Hyperlipidemia    Past Surgical History  Procedure Date  . Tubal ligation   . Right arm fx with orif    Family History  Problem Relation Age of Onset  . Dementia Mother   . Cancer Mother     breast  . Heart disease Mother   . Hypertension Mother   . Diabetes Father   . Colon cancer Neg Hx   . Stomach cancer Neg Hx    History   Social History  . Marital Status: Married    Spouse Name: [name]    Number of Children: 3  . Years of Education: 14   Occupational History  . retired    Social History Main Topics  . Smoking status: Never Smoker   . Smokeless tobacco: Never Used  . Alcohol Use: No  . Drug Use: No  . Sexually Active: Not Currently   Other Topics Concern  . Not on file   Social History Narrative   HSG, 2 years collegeMarried 1966. 3 sons- 1968, 104, 33. 3 grandchildren. Retired (disability)Lives with SO in their own home. ACP - no prolonged support on artificial means.     Current Outpatient Prescriptions on File Prior to Visit  Medication Sig Dispense  Refill  . aspirin 81 MG tablet Take 81 mg by mouth daily.        Marland Kitchen b complex vitamins capsule Take 1 capsule by mouth daily.      . betamethasone dipropionate (DIPROLENE) 0.05 % cream Apply topically 2 (two) times daily.        . calcium carbonate (OS-CAL) 600 MG TABS Take 600 mg by mouth daily.        . cholecalciferol (VITAMIN D) 1000 UNITS tablet Take 1,000 Units by mouth daily.      . fish oil-omega-3 fatty acids 1000 MG capsule Take 1 g by mouth daily.      . furosemide (LASIX) 40 MG tablet Take 1 tablet (40 mg total) by mouth daily.  90 tablet  3  . glipiZIDE (GLUCOTROL XL) 10 MG 24 hr tablet Take 1 tablet (10 mg total) by mouth daily.  90 tablet  3  . metFORMIN (GLUCOPHAGE) 1000 MG tablet TAKE ONE TABLET BY MOUTH TWICE DAILY  180 tablet  3  . Multiple Vitamin (MULTIVITAMIN) tablet Take 1 tablet by mouth daily.      . naproxen sodium (ANAPROX) 220 MG tablet Take 220 mg by mouth daily.        Marland Kitchen PARoxetine (PAXIL) 20  MG tablet Take 1 tablet (20 mg total) by mouth daily.  90 tablet  3  . quinapril (ACCUPRIL) 10 MG tablet Take 1 tablet (10 mg total) by mouth daily.  90 tablet  1  . rosuvastatin (CRESTOR) 10 MG tablet Take 1 tablet (10 mg total) by mouth daily.  90 tablet  3  . vitamin C (ASCORBIC ACID) 500 MG tablet Take 500 mg by mouth daily.      . vitamin E 400 UNIT capsule Take 400 Units by mouth daily.        Marland Kitchen zinc gluconate 50 MG tablet Take 50 mg by mouth daily.      . cetirizine (ZYRTEC) 10 MG tablet Take 10 mg by mouth daily.        Marland Kitchen omeprazole (PRILOSEC) 40 MG capsule Take 40 mg by mouth daily.      . simethicone (MYLICON) 125 MG chewable tablet Chew 125 mg by mouth every 6 (six) hours as needed.            Review of Systems System review is negative for any constitutional, cardiac, pulmonary, GI or neuro symptoms or complaints other than as described in the HPI.     Objective:   Physical Exam Filed Vitals:   10/21/11 1618  BP: 112/63  Pulse: 83  Temp: 98.3 F (36.8  C)  Resp: 16   Wt Readings from Last 3 Encounters:  10/21/11 198 lb (89.812 kg)  06/16/11 207 lb (93.895 kg)  06/02/11 207 lb 12.8 oz (94.257 kg)   Gen'l- pale and ill appearing white woman in no acute distress HEENT- [name], C&S clear Neck- supple Cor- 2+ radial , RRR Pulm - normal respirations Abd- obese Neuro - A&O x 3, speech is clear and intelligible, cognition is normal. CN II-XII - normal facial symmetry and movement, PERRLA, EOMI, no visual field cuts, tongue w/o fasciculations or deviation, normal shoulder shrug. MS - 4+/5 throughout. Cerebellar - high frequency resting tremor hands, minimal tremor with finger-to-nose maneuver, normal rapid finger movement, normal heel-shin maneuver, slow but normal gait, poor tandem gait, mild cog-wheeling rigidity UE.       Assessment & Plan:

## 2011-10-21 NOTE — Patient Instructions (Addendum)
Evaluation of tremor - an intention tremor on exam with no other focal neurologic findings. Many possibilities - highest on my list is essential tremor. Next is some type of parkinsonian tremor. Way down the list is any type of demylinating disease.   Will also check the routine: cholesterol and blood sugar.

## 2011-10-22 LAB — COMPREHENSIVE METABOLIC PANEL
AST: 27 U/L (ref 0–37)
Albumin: 4.3 g/dL (ref 3.5–5.2)
Alkaline Phosphatase: 59 U/L (ref 39–117)
BUN: 12 mg/dL (ref 6–23)
Potassium: 4.4 mEq/L (ref 3.5–5.1)
Sodium: 143 mEq/L (ref 135–145)

## 2011-10-22 LAB — HEPATIC FUNCTION PANEL
ALT: 22 U/L (ref 0–35)
AST: 27 U/L (ref 0–37)
Total Bilirubin: 0.7 mg/dL (ref 0.3–1.2)

## 2011-10-22 LAB — LIPID PANEL
Cholesterol: 94 mg/dL (ref 0–200)
LDL Cholesterol: 25 mg/dL (ref 0–99)
VLDL: 22.8 mg/dL (ref 0.0–40.0)

## 2011-10-22 NOTE — Assessment & Plan Note (Signed)
BP Readings from Last 3 Encounters:  10/21/11 112/63  06/16/11 131/81  03/27/11 118/64   Good control on present medical regimen

## 2011-10-22 NOTE — Assessment & Plan Note (Signed)
Lab Results  Component Value Date   HGBA1C 7.2* 03/27/2011   Due for follow-up lab with recommendations to follow.

## 2011-10-22 NOTE — Assessment & Plan Note (Signed)
Wt Readings from Last 3 Encounters:  10/21/11 198 lb (89.812 kg)  06/16/11 207 lb (93.895 kg)  06/02/11 207 lb 12.8 oz (94.257 kg)

## 2011-10-22 NOTE — Assessment & Plan Note (Signed)
Resting and action tremor. Neuro exam except for tremor and cogwheeling is normal. Concern is for essential tremor vs early parkinson's like disease vs demylinating disease  Plan Lab: RPR, B12, TSH, ceruloplasmin, BMet  Refer to neurology for diagnostic consultation: defer additional lab, testing or imaging to their recommendations.

## 2011-10-26 ENCOUNTER — Encounter: Payer: Self-pay | Admitting: Internal Medicine

## 2011-11-14 ENCOUNTER — Other Ambulatory Visit: Payer: Self-pay | Admitting: Diagnostic Neuroimaging

## 2011-11-14 DIAGNOSIS — G20 Parkinson's disease: Secondary | ICD-10-CM

## 2011-11-19 ENCOUNTER — Ambulatory Visit
Admission: RE | Admit: 2011-11-19 | Discharge: 2011-11-19 | Disposition: A | Discharge status: Home / Self Care | Payer: Medicare Other | Source: Ambulatory Visit | Attending: Diagnostic Neuroimaging | Admitting: Diagnostic Neuroimaging

## 2011-11-19 DIAGNOSIS — G20 Parkinson's disease: Secondary | ICD-10-CM

## 2011-12-30 DIAGNOSIS — G20 Parkinson's disease: Secondary | ICD-10-CM

## 2011-12-30 HISTORY — DX: Parkinson's disease: G20

## 2012-03-29 ENCOUNTER — Encounter: Payer: Medicare Other | Admitting: Internal Medicine

## 2012-04-30 ENCOUNTER — Other Ambulatory Visit: Payer: Self-pay | Admitting: *Deleted

## 2012-04-30 MED ORDER — ROSUVASTATIN CALCIUM 10 MG PO TABS: 10.0000 mg | ORAL_TABLET | Freq: Every day | ORAL | Status: DC

## 2012-05-13 ENCOUNTER — Encounter: Payer: Medicare Other | Admitting: Internal Medicine

## 2012-05-20 ENCOUNTER — Encounter: Payer: Self-pay | Admitting: Internal Medicine

## 2012-05-20 ENCOUNTER — Ambulatory Visit (INDEPENDENT_AMBULATORY_CARE_PROVIDER_SITE_OTHER): Payer: Medicare Other | Admitting: Internal Medicine

## 2012-05-20 ENCOUNTER — Other Ambulatory Visit (INDEPENDENT_AMBULATORY_CARE_PROVIDER_SITE_OTHER): Payer: Medicare Other

## 2012-05-20 VITALS — BP 116/72 | HR 84 | Temp 98.1°F | Resp 12 | Ht 64.0 in | Wt 194.0 lb

## 2012-05-20 DIAGNOSIS — E785 Hyperlipidemia, unspecified: Secondary | ICD-10-CM

## 2012-05-20 LAB — COMPREHENSIVE METABOLIC PANEL
Alkaline Phosphatase: 74 U/L (ref 39–117)
BUN: 15 mg/dL (ref 6–23)
Creatinine, Ser: 1 mg/dL (ref 0.4–1.2)
Glucose, Bld: 168 mg/dL — ABNORMAL HIGH (ref 70–99)
Total Bilirubin: 0.6 mg/dL (ref 0.3–1.2)

## 2012-05-20 LAB — HEMOGLOBIN A1C: Hgb A1c MFr Bld: 7.1 % — ABNORMAL HIGH (ref 4.6–6.5)

## 2012-05-20 NOTE — Progress Notes (Signed)
Subjective:    Patient ID: Barbara Bradshaw, female    DOB: January 13, 1946, 67 y.o.   MRN: [id number]  HPI Barbara Bradshaw  is here for annual Medicare wellness examination and management of other chronic and acute problems.   Has been recently diagnosed with parkinson's disease and she reports her medication really helps.    The risk factors are reflected in the social history.  The roster of all physicians providing medical care to patient - is listed in the Snapshot section of the chart.  Activities of daily living:  The patient is 100% inedpendent in all ADLs: dressing, toileting, feeding as well as independent mobility  Home safety : The patient has smoke detectors in the home. Falls - none. HOme is semi-fall safe. They wear seatbelts. No firearms at home . There is no violence in the home.   There is no risks for hepatitis, STDs or HIV. There is no  history of blood transfusion. They have no travel history to infectious disease endemic areas of the world.  The patient has seen their dentist in the last six month had upper denture made. They have not seen their eye doctor in the last year. They deny  any hearing difficulty and have not had audiologic testing in the last year.    They do not  have excessive sun exposure. Discussed the need for sun protection: hats, long sleeves and use of sunscreen if there is significant sun exposure.   Diet: the importance of a healthy diet is discussed. They do have a healthy diet.  The patient has no regular exercise program.  The benefits of regular aerobic exercise were discussed.  Depression screen: there are no signs or vegative symptoms of depression- irritability, change in appetite, anhedonia, sadness/tearfullness.  Cognitive assessment: the patient manages all their financial and personal affairs and is actively engaged.   The following portions of the patient's history were reviewed and updated as appropriate: allergies, current medications,  past family history, past medical history,  past surgical history, past social history  and problem list.  Past Medical History  Diagnosis Date  . Diabetes mellitus, type 2   . Hypertension   . Rosacea   . Nephrolithiasis   . Hyperlipidemia   . Parkinson's disease 12/2011   Past Surgical History  Procedure Laterality Date  . Tubal ligation    . Right arm fx with orif     Family History  Problem Relation Age of Onset  . Dementia Mother   . Cancer Mother     breast  . Heart disease Mother   . Hypertension Mother   . Diabetes Father   . Colon cancer Neg Hx   . Stomach cancer Neg Hx    History   Social History  . Marital Status: Married    Spouse Name: Barbara Bradshaw    Number of Children: 3  . Years of Education: 14   Occupational History  . retired    Social History Main Topics  . Smoking status: Never Smoker   . Smokeless tobacco: Never Used  . Alcohol Use: No  . Drug Use: No  . Sexually Active: Not Currently   Other Topics Concern  . Not on file   Social History Narrative   HSG, 2 years collegeMarried 1966. 3 sons- 1968, 101, 33. 3 grandchildren. Retired (disability)   Lives with SO in their own home. ACP - no prolonged support on artificial means.     Current Outpatient Prescriptions on File Prior  to Visit  Medication Sig Dispense Refill  . aspirin 81 MG tablet Take 81 mg by mouth daily.        Marland Kitchen b complex vitamins capsule Take 1 capsule by mouth daily.      . betamethasone dipropionate (DIPROLENE) 0.05 % cream Apply topically 2 (two) times daily.        . calcium carbonate (OS-CAL) 600 MG TABS Take 600 mg by mouth daily.        . cetirizine (ZYRTEC) 10 MG tablet Take 10 mg by mouth daily.        . cholecalciferol (VITAMIN D) 1000 UNITS tablet Take 1,000 Units by mouth daily.      . fish oil-omega-3 fatty acids 1000 MG capsule Take 1 g by mouth daily.      Marland Kitchen glipiZIDE (GLUCOTROL XL) 10 MG 24 hr tablet Take 1 tablet (10 mg total) by mouth daily.  90 tablet  3   . Multiple Vitamin (MULTIVITAMIN) tablet Take 1 tablet by mouth daily.      . naproxen sodium (ANAPROX) 220 MG tablet Take 220 mg by mouth daily.        . quinapril (ACCUPRIL) 10 MG tablet Take 1 tablet (10 mg total) by mouth daily.  90 tablet  1  . rosuvastatin (CRESTOR) 10 MG tablet Take 1 tablet (10 mg total) by mouth daily.  90 tablet  0  . simethicone (MYLICON) 125 MG chewable tablet Chew 125 mg by mouth every 6 (six) hours as needed.        . vitamin C (ASCORBIC ACID) 500 MG tablet Take 500 mg by mouth daily.      . vitamin E 400 UNIT capsule Take 400 Units by mouth daily.        Marland Kitchen zinc gluconate 50 MG tablet Take 50 mg by mouth daily.      . furosemide (LASIX) 40 MG tablet Take 1 tablet (40 mg total) by mouth daily.  90 tablet  3  . omeprazole (PRILOSEC) 40 MG capsule Take 40 mg by mouth daily.      Marland Kitchen PARoxetine (PAXIL) 20 MG tablet Take 1 tablet (20 mg total) by mouth daily.  90 tablet  3   No current facility-administered medications on file prior to visit.     Vision, hearing, body mass index were assessed and reviewed.   During the course of the visit the patient was educated and counseled about appropriate screening and preventive services including : fall prevention , diabetes screening, nutrition counseling, colorectal cancer screening, and recommended immunizations.    Review of Systems Constitutional:  Negative for fever, chills, activity change and unexpected weight change.  HEENT:  Negative for hearing loss, ear pain, congestion, neck stiffness and postnasal drip. Negative for sore throat or swallowing problems. Negative for dental complaints.   Eyes: Negative for vision loss or change in visual acuity.  Respiratory: Negative for chest tightness and wheezing. Negative for DOE.   Cardiovascular: Negative for chest pain or palpitations. No decreased exercise tolerance Gastrointestinal: No change in bowel habit. No bloating or gas. No reflux or indigestion Genitourinary:  Negative for urgency, frequency, flank pain and difficulty urinating.  Musculoskeletal: Negative for myalgias, back pain, arthralgias and gait problem.  Neurological: Negative for dizziness, tremors, weakness and headaches.  Hematological: Negative for adenopathy.  Psychiatric/Behavioral: Negative for behavioral problems and dysphoric mood.       Objective:   Physical Exam Filed Vitals:   05/20/12 1542  BP: 116/72  Pulse: 84  Temp: 98.1 F (36.7 C)  Resp: 12   Wt Readings from Last 3 Encounters:  05/20/12 194 lb (87.998 kg)  10/21/11 198 lb (89.812 kg)  06/16/11 207 lb (93.895 kg)   Gen'l: well nourished, well developed obese white Woman in no distress HEENT - [place]/AT, EACs/TMs normal, oropharynx with full upper denture and native dentition mandible, missing many teeth,  in good condition, no buccal, posterior pharynx clear, mucous membranes moist. C&S clear, PERRLA,  Neck - supple, no thyromegaly Nodes- negative submental, cervical, supraclavicular regions Chest - no deformity, no CVAT Lungs - clear without rales, wheezes. No increased work of breathing Breast - - Skin normal, nipples w/o discharge, pendulous no fixed mass or lesion, no axillary adenopathy. Cardiovascular - regular rate and rhythm, quiet precordium, no murmurs, rubs or gallops, 2+ radial, DP and PT pulses Abdomen - obese BS+ x 4, no HSM, no guarding or rebound or tenderness Pelvic - deferred  Rectal - deferred  Extremities - no clubbing, cyanosis, edema or deformity. Scar at right elbow Neuro - A&O x 3, CN II-XII normal, motor strength normal and equal, DTRs 2+ and symmetrical biceps, radial, and patellar tendons. Cerebellar - no tremor, no rigidity, fluid movement and normal gait. Nl sensation to light touch, pin-prick but decreased to vibration right foot. Derm - Head, neck, back, abdomen and extremities without suspicious lesions  Lab Results  Component Value Date   WBC 9.1 10/21/2011   HGB 13.1 10/21/2011    HCT 39.8 10/21/2011   PLT 136.0* 10/21/2011   GLUCOSE 168* 05/20/2012   CHOL 94 10/21/2011   TRIG 114.0 10/21/2011   HDL 46.20 10/21/2011   LDLDIRECT 40.1 03/09/2007   LDLCALC 25 10/21/2011   ALT 10 05/20/2012   AST 24 05/20/2012   NA 141 05/20/2012   K 4.3 05/20/2012   CL 102 05/20/2012   CREATININE 1.0 05/20/2012   BUN 15 05/20/2012   CO2 32 05/20/2012   TSH 0.31* 10/21/2011   HGBA1C 7.1* 05/20/2012          Assessment & Plan:

## 2012-05-20 NOTE — Patient Instructions (Addendum)
Thanks for coming in  You seem to be doing OK. It is great that you have lost 13 lb in 12 months - keep up the good work. PORTION SIZE is key.  Full labs will be done and included in the note I send and on MyChart - please sign up.

## 2012-05-23 NOTE — Assessment & Plan Note (Signed)
Interval history w/o major illness or change in condition. Physical exam, sans breast and pelvic, ok except for weight. Labs reviewed - in normal range. She is current with colorectal and breast cancer screening. Immunizations are up to date. She is encouraged to eat smart and loose weight, exercise every day. She will return in 6 months for interim follow-up.

## 2012-05-23 NOTE — Assessment & Plan Note (Signed)
BMI - 33.  Plan Weight management: smart food choices, PORTION SIZE CONTROL, regular exercise

## 2012-05-23 NOTE — Assessment & Plan Note (Signed)
Lipid panel July '13 - excellent control. LFTs at this exam are normal.  PLan Continue present medical regimen  Life style - continue low fat diet.

## 2012-05-23 NOTE — Assessment & Plan Note (Signed)
She reports she is doing much better on Sinemet with control of tremors.   Plan Per Neurology

## 2012-05-23 NOTE — Assessment & Plan Note (Signed)
BP Readings from Last 3 Encounters:  05/20/12 116/72  10/21/11 112/63  06/16/11 131/81   Very good control on ACE-I alone

## 2012-05-23 NOTE — Assessment & Plan Note (Signed)
A1C 7.1%  - very close to goal of 7% or less  Plan Continue present medications  Continue to follow no sugar, low carb diet; exercise; loose weight

## 2012-06-09 ENCOUNTER — Other Ambulatory Visit: Payer: Self-pay

## 2012-06-09 MED ORDER — GLIPIZIDE ER 10 MG PO TB24: 10.0000 mg | ORAL_TABLET | Freq: Every day | ORAL | Status: DC

## 2012-07-05 ENCOUNTER — Other Ambulatory Visit: Payer: Self-pay

## 2012-07-05 MED ORDER — FUROSEMIDE 40 MG PO TABS: 40.0000 mg | ORAL_TABLET | Freq: Every day | ORAL | Status: DC

## 2012-07-05 MED ORDER — PAROXETINE HCL 20 MG PO TABS
20.0000 mg | ORAL_TABLET | Freq: Every day | ORAL | Status: DC
Start: 2012-07-05 — End: 2013-04-13

## 2012-08-03 ENCOUNTER — Ambulatory Visit (INDEPENDENT_AMBULATORY_CARE_PROVIDER_SITE_OTHER): Payer: Medicare Other | Admitting: Diagnostic Neuroimaging

## 2012-08-03 ENCOUNTER — Encounter: Payer: Self-pay | Admitting: Diagnostic Neuroimaging

## 2012-08-03 VITALS — BP 121/66 | HR 76 | Temp 98.4°F | Ht 64.0 in | Wt 189.0 lb

## 2012-08-03 DIAGNOSIS — G20 Parkinson's disease: Secondary | ICD-10-CM

## 2012-08-03 MED ORDER — CARBIDOPA-LEVODOPA 25-100 MG PO TABS: 1.5000 | ORAL_TABLET | Freq: Three times a day (TID) | ORAL | Status: DC

## 2012-08-03 NOTE — Patient Instructions (Signed)
Continue current medications. 

## 2012-08-03 NOTE — Progress Notes (Signed)
[facility]  PATIENT: Barbara Bradshaw DOB: March 27, 1946  REFERRING CLINICIAN:  HISTORY FROM: patient and husband REASON FOR VISIT: follow up   HISTORICAL  CHIEF COMPLAINT:  Chief Complaint  Patient presents with  . Follow-up    HISTORY OF PRESENT ILLNESS:   UPDATE 08/03/12: since last visit no new changes. Patient's tremor is under good control with her carbidopa/levodopa. Sometimes she forgets to take the midday dose. She takes her medications regularly, it seems to work well. If she misses a dose, she notices wearing off. Patient also reports mild short-term memory loss. No anxiety. Some constipation. I noted some oral-lingual dyskinesias today, which patient does not notice. Husband has noticed this. She attributes this to her ill fitting dentures.  UPDATE 03/08/12: Since last visit, doing better. Increased carb/levo seems to work better. Some off-time in early AM, otherise no waearing off. No side effects. No anxiety, constipation or falls. some swallow diff, but mild.  UPDATE 01/01/12:  She feels like carbidopa-levodopa has helped 15%.  She is tolerating carbidopa-levodopa well without dizziness, she has had occasional nausea.  She takes her midday dose with her meals.  Denies any falls. No change in smell and taste, bowel or bladder.  PRIOR HPI (Dr. Marjory Lies) 67 year old right-handed female with history of diabetes, hypertension, hyperlipidemia, here for evaluation of tremor for the past one year.  Patient noted intermittent tremor when she is doing certain actions such as handwriting, cooking, holding objects. Her husband has noted some intermittent resting tremor. Patient fell down in June 2012. She feels that her balance and walking have slightly worsened over time. Husband has noticed change in her voice. Patient having a little bit more anxiety, as well as very vivid dreams lately. Patient has family history Parkinson's disease in her sister and her father. No  change in sense of smell or taste.   REVIEW OF SYSTEMS: Full 14 system review of systems performed and notable only for weight loss trouble swallowing constipation easy bruising tremor headache memory loss.  ALLERGIES: No Known Allergies  HOME MEDICATIONS: Outpatient Prescriptions Prior to Visit  Medication Sig Dispense Refill  . aspirin 81 MG tablet Take 81 mg by mouth daily.        Marland Kitchen b complex vitamins capsule Take 1 capsule by mouth daily.      . betamethasone dipropionate (DIPROLENE) 0.05 % cream Apply topically 2 (two) times daily.        . calcium carbonate (OS-CAL) 600 MG TABS Take 600 mg by mouth daily.        . cetirizine (ZYRTEC) 10 MG tablet Take 10 mg by mouth daily as needed.       . cholecalciferol (VITAMIN D) 1000 UNITS tablet Take 1,000 Units by mouth daily.      . fish oil-omega-3 fatty acids 1000 MG capsule Take 1 g by mouth daily.      . furosemide (LASIX) 40 MG tablet Take 1 tablet (40 mg total) by mouth daily.  90 tablet  3  . glipiZIDE (GLUCOTROL XL) 10 MG 24 hr tablet Take 1 tablet (10 mg total) by mouth daily.  90 tablet  3  . metFORMIN (GLUCOPHAGE) 1000 MG tablet Take 1,000 mg by mouth daily.       . Multiple Vitamin (MULTIVITAMIN) tablet Take 1 tablet by mouth daily.      Marland Kitchen PARoxetine (PAXIL) 20 MG tablet Take 1 tablet (20 mg total) by mouth daily.  90 tablet  3  . quinapril (ACCUPRIL) 10  MG tablet Take 1 tablet (10 mg total) by mouth daily.  90 tablet  1  . rosuvastatin (CRESTOR) 10 MG tablet Take 1 tablet (10 mg total) by mouth daily.  90 tablet  0  . simethicone (MYLICON) 125 MG chewable tablet Chew 125 mg by mouth every 6 (six) hours as needed.        . vitamin C (ASCORBIC ACID) 500 MG tablet Take 500 mg by mouth daily.      Marland Kitchen zinc gluconate 50 MG tablet Take 50 mg by mouth daily.      . carbidopa-levodopa (SINEMET IR) 25-100 MG per tablet 1.5 tablets 3 (three) times daily.       . vitamin E 400 UNIT capsule Take 400 Units by mouth daily.        . naproxen  sodium (ANAPROX) 220 MG tablet Take 220 mg by mouth daily.        Marland Kitchen omeprazole (PRILOSEC) 40 MG capsule Take 40 mg by mouth daily.       No facility-administered medications prior to visit.    PAST MEDICAL HISTORY: Past Medical History  Diagnosis Date  . Diabetes mellitus, type 2   . Hypertension   . Rosacea   . Nephrolithiasis   . Hyperlipidemia   . Parkinson's disease 12/2011  . Parkinson disease     PAST SURGICAL HISTORY: Past Surgical History  Procedure Laterality Date  . Tubal ligation    . Right arm fx with orif      FAMILY HISTORY: Family History  Problem Relation Age of Onset  . Dementia Mother   . Cancer Mother     breast  . Heart disease Mother   . Hypertension Mother   . Diabetes Father   . Colon cancer Neg Hx   . Stomach cancer Neg Hx     SOCIAL HISTORY:  History   Social History  . Marital Status: Married    Spouse Name: [name]    Number of Children: 3  . Years of Education: 14   Occupational History  . retired    Social History Main Topics  . Smoking status: Never Smoker   . Smokeless tobacco: Never Used  . Alcohol Use: No  . Drug Use: No  . Sexually Active: Not Currently   Other Topics Concern  . Not on file   Social History Narrative   HSG, 2 years collegeMarried 1966. 3 sons- 1968, 5, 21. 3 grandchildren. Retired (disability)   Lives with SO in their own home. ACP - no prolonged support on artificial means.      PHYSICAL EXAM  Filed Vitals:   08/03/12 1538  BP: 121/66  Pulse: 76  Temp: 98.4 F (36.9 C)  TempSrc: Oral  Height: 5\' 4"  (1.626 m)  Weight: 189 lb (85.73 kg)   Body mass index is 32.43 kg/(m^2).  EXAM: General: Patient is awake, alert and in no acute distress.  Well developed and groomed. MASKED FACIES. Neck: Neck is supple. Cardiovascular: No carotid artery bruits.  Heart is regular rate and rhythm with no murmurs.  Neurologic Exam  Mental Status: Awake, alert. Language is fluent and comprehension  intact.  NEG MYERSONS. NEG SNOUT.  Cranial Nerves: Pupils are equal and reactive to light.  Visual fields are full to confrontation.  Conjugate eye movements are full and symmetric; EXCEPT FOR RIGHT EXO/HYPERPHORIA. Facial sensation and strength are symmetric.  Hearing is intact.  Palate elevated symmetrically and uvula is midline.  Shoulder shrug is symmetric.  Tongue  is midline. ORO-LINGUAL DYSKINESIAS. Motor: Normal bulk. MILD ACTION/POSTURAL. NO REST TREMOR. BRADYKINESIA IN BUE. COGWHEELING IN BUE. Full strength in the upper and lower extremities.  No pronator drift. Sensory: Intact and symmetric to light touch and temperature Coordination: No ataxia or dysmetria on finger-nose or rapid alternating movement testing. Gait and Station: STOOPED POSTURE. MODERATE ARM SWING.  Reflexes: Deep tendon reflexes in the upper and lower extremity are present and symmetric; TRACE AT ANKLES.   DIAGNOSTIC DATA (LABS, IMAGING, TESTING) - I reviewed patient records, labs, notes, testing and imaging myself where available.  Lab Results  Component Value Date   WBC 9.1 10/21/2011   HGB 13.1 10/21/2011   HCT 39.8 10/21/2011   MCV 87.4 10/21/2011   PLT 136.0* 10/21/2011      Component Value Date/Time   NA 141 05/20/2012 1645   K 4.3 05/20/2012 1645   CL 102 05/20/2012 1645   CO2 32 05/20/2012 1645   GLUCOSE 168* 05/20/2012 1645   BUN 15 05/20/2012 1645   CREATININE 1.0 05/20/2012 1645   CALCIUM 10.3 05/20/2012 1645   PROT 7.5 05/20/2012 1645   ALBUMIN 4.5 05/20/2012 1645   AST 24 05/20/2012 1645   ALT 10 05/20/2012 1645   ALKPHOS 74 05/20/2012 1645   BILITOT 0.6 05/20/2012 1645   GFRNONAA 65.12 01/14/2010 1526   GFRAA 109 02/15/2008 1323   Lab Results  Component Value Date   CHOL 94 10/21/2011   HDL 46.20 10/21/2011   LDLCALC 25 10/21/2011   LDLDIRECT 40.1 03/09/2007   TRIG 114.0 10/21/2011   CHOLHDL 2 10/21/2011   Lab Results  Component Value Date   HGBA1C 7.1* 05/20/2012   Lab Results  Component Value Date    VITAMINB12 546 10/21/2011   Lab Results  Component Value Date   TSH 0.31* 10/21/2011     ASSESSMENT AND PLAN  67 y.o. year-old female with progressive mixed tremor (action and postural greater than resting), bradykinesia, cogwheeling rigidity, postural instability. She has 4 of the 4 cardinal features for Parkinson's disease. Doing well on carb/levo.  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  Ddx: idiopathic parkinson's disease  PLAN: 1. continue carbidopa-levodopa 1.5tabs TID 2. may consider azilect or dopamine agonist in future   Suanne Marker, MD 08/03/2012, 4:04 PM Certified in Neurology, Neurophysiology and Neuroimaging  [facility] [address], [address] [place], [place] [address] [phone]

## 2012-08-04 ENCOUNTER — Ambulatory Visit: Payer: Self-pay | Admitting: Diagnostic Neuroimaging

## 2012-08-05 ENCOUNTER — Telehealth: Payer: Self-pay

## 2012-08-05 NOTE — Telephone Encounter (Signed)
Patient called stating that she was referred to Dr. Marjory Lies and diagnosed with Parkinson. Patient mother live in [place] and about eight years ago she was given court ordered conservatorship. Patient is requesting a letter that states her condition and recommends that she be withdrawn from the conservatorship agreement. Letter must be sent to [facility] [address] [place], [place] [address]. Letter may also be faxed to [phone]. Pt is aware the Dr. Debby Bud is out of the office and message will be forwarded to another partner to address.

## 2012-08-13 ENCOUNTER — Other Ambulatory Visit: Payer: Self-pay | Admitting: *Deleted

## 2012-08-13 ENCOUNTER — Other Ambulatory Visit: Payer: Self-pay

## 2012-08-13 MED ORDER — QUINAPRIL HCL 10 MG PO TABS: 10.0000 mg | ORAL_TABLET | Freq: Every day | ORAL | Status: DC

## 2012-08-13 NOTE — Telephone Encounter (Signed)
Left msg on triage needing refill on her quinapril. Called pt back inform her will send to [facility]...lmb

## 2012-08-19 ENCOUNTER — Telehealth: Payer: Self-pay

## 2012-08-19 MED ORDER — ROSUVASTATIN CALCIUM 10 MG PO TABS: 10.0000 mg | ORAL_TABLET | Freq: Every day | ORAL | Status: DC

## 2012-08-19 NOTE — Telephone Encounter (Signed)
Pt notified Crestor was sent to [facility] on [facility].

## 2012-09-18 ENCOUNTER — Other Ambulatory Visit: Payer: Self-pay | Admitting: Internal Medicine

## 2012-09-20 ENCOUNTER — Telehealth: Payer: Self-pay | Admitting: Internal Medicine

## 2012-09-20 NOTE — Telephone Encounter (Signed)
Pt needs a new RX for Metformin to go to [facility] on [address].

## 2012-09-20 NOTE — Telephone Encounter (Signed)
Pt notified rx had already been sent a few days ago.

## 2012-11-22 ENCOUNTER — Encounter: Payer: Self-pay | Admitting: Internal Medicine

## 2012-11-22 ENCOUNTER — Ambulatory Visit (INDEPENDENT_AMBULATORY_CARE_PROVIDER_SITE_OTHER): Payer: Medicare Other | Admitting: Internal Medicine

## 2012-11-22 VITALS — BP 120/70 | HR 73 | Temp 98.4°F | Wt 191.4 lb

## 2012-11-22 DIAGNOSIS — R319 Hematuria, unspecified: Secondary | ICD-10-CM

## 2012-11-22 DIAGNOSIS — L299 Pruritus, unspecified: Secondary | ICD-10-CM

## 2012-11-22 DIAGNOSIS — R300 Dysuria: Secondary | ICD-10-CM

## 2012-11-22 LAB — POCT URINALYSIS DIPSTICK
Glucose, UA: NEGATIVE
Nitrite, UA: NEGATIVE
Urobilinogen, UA: 0.2

## 2012-11-22 NOTE — Patient Instructions (Addendum)
Thanks for working with me Romeo Apple) today.  Based on your presentation, your bleeding could be due to a number of things, but one of these possibilities is a malignancy.  Based on your age (over 70) we want to refer you to a [facility] to examine you.  They will be able to do a thorough examination in their office and to determine what this is.  Plan: we will refer you to the urologist. Please make an appointment.

## 2012-11-22 NOTE — Progress Notes (Signed)
Subjective:     Patient ID: Barbara Bradshaw, female   DOB: [birth year], 67 y.o.   MRN: [id number]  HPI Barbara Bradshaw is a 67 year old lady who presents because she thinks she has a UTI, though she has never had one before and didn't know what else this could be.  Past medical history is significant for nephrolithiasis.  She first noticed frank blood in her urine upon returning from [place] at the start of this month.  Since then, she hasn't noticed any frank blood in the urine but has noticed dark urine and has had to change underpants twice per day due to bloody discharge in the front of the underpants.  She denies any pain, urgency, fever, diaphoresis or chills but endorses an intermittent itch.  She does not recall any URI in the weeks preceding her hematuria.  She had been taking cranberry pills x5d and couldn't tell if they've been helping.  No fever, no urgency, no dysuria, no nocturia.    Past Medical History  Diagnosis Date   Diabetes mellitus, type 2    Hypertension    Rosacea    Nephrolithiasis    Hyperlipidemia    Parkinson's disease 12/2011   Parkinson disease    Past Surgical History  Procedure Laterality Date   Tubal ligation     Right arm fx with orif     Family History  Problem Relation Age of Onset   Dementia Mother    Cancer Mother     breast   Heart disease Mother    Hypertension Mother    Diabetes Father    Colon cancer Neg Hx    Stomach cancer Neg Hx    History   Social History   Marital Status: Married    Spouse Name: [name]    Number of Children: 3   Years of Education: 14   Occupational History   retired    Social History Main Topics   Smoking status: Never Smoker    Smokeless tobacco: Never Used   Alcohol Use: No   Drug Use: No   Sexual Activity: Not Currently   Other Topics Concern   Not on file   Social History Narrative   HSG, 2 years collegeMarried 1966. 3 sons- 1968, 80, 57. 3 grandchildren. Retired  (disability)   Lives with SO in their own home. ACP - no prolonged support on artificial means.     Current Outpatient Prescriptions on File Prior to Visit  Medication Sig Dispense Refill   aspirin 81 MG tablet Take 81 mg by mouth daily.         b complex vitamins capsule Take 1 capsule by mouth daily.       betamethasone dipropionate (DIPROLENE) 0.05 % cream Apply topically 2 (two) times daily.         calcium carbonate (OS-CAL) 600 MG TABS Take 600 mg by mouth daily.         carbidopa-levodopa (SINEMET IR) 25-100 MG per tablet Take 1.5 tablets by mouth 3 (three) times daily.  180 tablet  12   cetirizine (ZYRTEC) 10 MG tablet Take 10 mg by mouth daily as needed.        cholecalciferol (VITAMIN D) 1000 UNITS tablet Take 1,000 Units by mouth daily.       fish oil-omega-3 fatty acids 1000 MG capsule Take 1 g by mouth daily.       furosemide (LASIX) 40 MG tablet Take 1 tablet (40 mg total) by mouth daily.  90 tablet  3   glipiZIDE (GLUCOTROL XL) 10 MG 24 hr tablet Take 1 tablet (10 mg total) by mouth daily.  90 tablet  3   metFORMIN (GLUCOPHAGE) 1000 MG tablet TAKE ONE TABLET BY MOUTH TWICE DAILY  180 tablet  0   Multiple Vitamin (MULTIVITAMIN) tablet Take 1 tablet by mouth daily.       PARoxetine (PAXIL) 20 MG tablet Take 1 tablet (20 mg total) by mouth daily.  90 tablet  3   quinapril (ACCUPRIL) 10 MG tablet Take 1 tablet (10 mg total) by mouth daily.  90 tablet  2   rosuvastatin (CRESTOR) 10 MG tablet Take 1 tablet (10 mg total) by mouth daily.  90 tablet  3   simethicone (MYLICON) 125 MG chewable tablet Chew 125 mg by mouth every 6 (six) hours as needed.         vitamin C (ASCORBIC ACID) 500 MG tablet Take 500 mg by mouth daily.       zinc gluconate 50 MG tablet Take 50 mg by mouth daily.       No current facility-administered medications on file prior to visit.     Review of Systems Constitutional: as per HPI HENT: no changes in vision or hearing CV: Negative for  chest pain, palpitations Pulm: Negative for cough/choking, SOB, difficulty breathing GI: No difficulty swallowing, No NVCD GU: as per HPI    Objective:   Physical Exam General: 67 year old lady in NAD HENT: PERRL, CN 2-12 bilaterally intact CV: RRR, nl s1/s2, no murmurs/rubs/gallops Pulm: lungs bilaterally clear to auscultation GU: No CVA tenderness  Urinalysis    Component Value Date/Time   COLORURINE DK YELLOW 11/03/2007 1457   APPEARANCEUR Clear 11/03/2007 1457   LABSPEC 1.020 11/03/2007 1457   PHURINE 5.5 11/03/2007 1457   BILIRUBINUR moderate 11/22/2012 1431   BILIRUBINUR NEGATIVE 11/03/2007 1457   KETONESUR TRACE* 11/03/2007 1457   UROBILINOGEN 0.2 11/22/2012 1431   UROBILINOGEN 0.2 mg/dL 4/0/9811 9147   NITRITE negative 11/22/2012 1431   NITRITE Negative 11/03/2007 1457   LEUKOCYTESUR large (3+) 11/22/2012 1431      Assessment and Plan:     Painless hematuria: Since blood in the urine is a sentinal sign for a urologic malignancy, and she is over 67, we will refer Barbara Bradshaw to [facility] so they can aggressively work her up and evaluate her for the possibility of malignancy.    Plan: we will refer her to [facility] for workup and management. Follow up with them per their instructions.

## 2012-11-24 ENCOUNTER — Telehealth: Payer: Self-pay

## 2012-11-24 NOTE — Telephone Encounter (Signed)
Received a fax from [facility] requesting office note and labs pertaining to patient's upcoming office visit faxed to [phone]. This was done.

## 2012-12-31 ENCOUNTER — Other Ambulatory Visit: Payer: Self-pay | Admitting: Urology

## 2013-01-07 ENCOUNTER — Telehealth: Payer: Self-pay | Admitting: *Deleted

## 2013-01-07 NOTE — Telephone Encounter (Signed)
I've seen a doctor at [facility] urology. He is recommending some surgery for some discharge since july I am having. I have been diagnosed with Parkinson's by Dr. Marjory Lies and wonder if the two were related? Please call and advise before I commit to surgery.

## 2013-01-13 NOTE — Telephone Encounter (Signed)
Dr. Marjory Lies out of the office. I consulted Dr. Hosie Poisson and asked re: relationship with urine/vaginal discharge to PD.  He stated only urgency incontinence. I told her that I would follow urologist recommendation to find out reason for sx.  She verbalized understanding.

## 2013-01-24 ENCOUNTER — Ambulatory Visit: Admit: 2013-01-24 | Payer: Medicare Other | Admitting: Urology

## 2013-01-24 SURGERY — CYSTOURETEROSCOPY, WITH RETROGRADE PYELOGRAM AND STENT INSERTION
Anesthesia: General | Laterality: Bilateral

## 2013-02-08 LAB — HM PAP SMEAR

## 2013-02-10 ENCOUNTER — Other Ambulatory Visit: Payer: Self-pay

## 2013-02-10 ENCOUNTER — Other Ambulatory Visit (HOSPITAL_COMMUNITY)
Admission: RE | Admit: 2013-02-10 | Discharge: 2013-02-10 | Disposition: A | Discharge status: Home / Self Care | Payer: Medicare Other | Source: Ambulatory Visit | Attending: Gynecology | Admitting: Gynecology

## 2013-02-10 ENCOUNTER — Ambulatory Visit (INDEPENDENT_AMBULATORY_CARE_PROVIDER_SITE_OTHER): Payer: Medicare Other | Admitting: Gynecology

## 2013-02-10 ENCOUNTER — Other Ambulatory Visit: Payer: Self-pay | Admitting: Gynecology

## 2013-02-10 ENCOUNTER — Encounter: Payer: Self-pay | Admitting: Gynecology

## 2013-02-10 VITALS — BP 128/80 | Ht 61.75 in | Wt 190.0 lb

## 2013-02-10 DIAGNOSIS — N950 Postmenopausal bleeding: Secondary | ICD-10-CM

## 2013-02-10 DIAGNOSIS — Z1151 Encounter for screening for human papillomavirus (HPV): Secondary | ICD-10-CM | POA: Insufficient documentation

## 2013-02-10 DIAGNOSIS — Z124 Encounter for screening for malignant neoplasm of cervix: Secondary | ICD-10-CM | POA: Insufficient documentation

## 2013-02-10 DIAGNOSIS — N83339 Acquired atrophy of ovary and fallopian tube, unspecified side: Secondary | ICD-10-CM

## 2013-02-10 DIAGNOSIS — N952 Postmenopausal atrophic vaginitis: Secondary | ICD-10-CM | POA: Insufficient documentation

## 2013-02-10 DIAGNOSIS — Z780 Asymptomatic menopausal state: Secondary | ICD-10-CM

## 2013-02-10 DIAGNOSIS — Z1231 Encounter for screening mammogram for malignant neoplasm of breast: Secondary | ICD-10-CM

## 2013-02-10 NOTE — Progress Notes (Signed)
Barbara Bradshaw 07/15/1945 [id number]   History:    67 y.o.  for GYN exam. Patient also having postmenopausal bleeding. Patient reports that in August she bled very heavy for one day and ever since then she has been spotting until approximately 2 weeks ago. Patient states that she has never been on any hormone replacement therapy in the past. She has not had a gynecological examination over 3 years since her Previous gynecologist has retired. Dr. Debby Bud is her PCP who has been managing her type 2 diabetes, hyperlipidemia and hypertension. See problem list and medication list for details.  The patient denies any prior history of abnormal Pap smear. Patient denied any gynecological procedures in the past except tubal ligation. She stated she had a normal colonoscopy in 2013 but her last mammogram was in 2011. Patient went of prior bone density study. Patient is up-to-date on her vaccine except shingles. Patient states that her mother had history of breast cancer.   Past medical history,surgical history, family history and social history were all reviewed and documented in the EPIC chart.  Gynecologic History No LMP recorded. Patient is postmenopausal. Contraception: post menopausal status Last Pap: many years ago. Results were: normal Last mammogram: 2011. Results were: normal  Obstetric History OB History  Gravida Para Term Preterm AB SAB TAB Ectopic Multiple Living  3 3        3     # Outcome Date GA Lbr Len/2nd Weight Sex Delivery Anes PTL Lv  3 PAR           2 PAR           1 PAR                ROS: A ROS was performed and pertinent positives and negatives are included in the history.  GENERAL: No fevers or chills. HEENT: No change in vision, no earache, sore throat or sinus congestion. NECK: No pain or stiffness. CARDIOVASCULAR: No chest pain or pressure. No palpitations. PULMONARY: No shortness of breath, cough or wheeze. GASTROINTESTINAL: No abdominal pain, nausea, vomiting or  diarrhea, melena or bright red blood per rectum. GENITOURINARY: No urinary frequency, urgency, hesitancy or dysuria. MUSCULOSKELETAL: No joint or muscle pain, no back pain, no recent trauma. DERMATOLOGIC: No rash, no itching, no lesions. ENDOCRINE: No polyuria, polydipsia, no heat or cold intolerance. No recent change in weight. HEMATOLOGICAL: No anemia or easy bruising or bleeding. NEUROLOGIC: No headache, seizures, numbness, tingling or weakness. PSYCHIATRIC: No depression, no loss of interest in normal activity or change in sleep pattern.     Exam: chaperone present  BP 128/80  Ht 5' 1.75" (1.568 m)  Wt 190 lb (86.183 kg)  BMI 35.05 kg/m2  Body mass index is 35.05 kg/(m^2).  General appearance : Well developed well nourished female. No acute distress HEENT: Neck supple, trachea midline, no carotid bruits, no thyroidmegaly Lungs: Clear to auscultation, no rhonchi or wheezes, or rib retractions  Heart: Regular rate and rhythm, no murmurs or gallops Breast:Examined in sitting and supine position were symmetrical in appearance, no palpable masses or tenderness,  no skin retraction, no nipple inversion, no nipple discharge, no skin discoloration, no axillary or supraclavicular lymphadenopathy Abdomen: no palpable masses or tenderness, no rebound or guarding Extremities: no edema or skin discoloration or tenderness  Pelvic:  Bartholin, Urethra, Skene Glands: Within normal limits             Vagina: friable on contact atrophic changes  Cervix: No gross lesions or discharge  Uterus slightly anteverted upper limits of normal nontender  Adnexa  Without masses or tenderness  Anus and perineum  normal   Rectovaginal  normal sphincter tone without palpated masses or tenderness             Hemoccult PCP provides   Besides doing her Pap smear today with a vigorous Cytobrush to sample the endocervical canal her cervix was slightly dilated after cleaning with Betadine solution in an effort to do an  endometrial biopsy with a sterile Pipelle. Very little tissue was obtained but submitted for histological evaluation. Patient was counseled prior to the procedure.  Assessment/Plan:  67 y.o. female with postmenopausal bleeding. Results of Pap smear and endometrial biopsy pending at this time. Patient will return back to the office for a sonohysterogram to rule out any intracavitary defect as well as for better assessment of right adnexa. We will hold off on any treatment for vaginal atrophy until complete evaluation of her postmenopausal bleeding has been completed. The patient was given a requisition to schedule her mammogram. She will have a bone density study here in our office at the end of December or beginning of January. Information was provided on endometrial biopsy as well as on sonohysterogram. PCP has been doing her lab work.  Note: This dictation was prepared with  Dragon/digital dictation along withSmart phrase technology. Any transcriptional errors that result from this process are unintentional.   Ok Edwards MD, 12:06 PM 02/10/2013

## 2013-02-10 NOTE — Patient Instructions (Signed)
Transvaginal Ultrasound Transvaginal ultrasound is a pelvic ultrasound, using a metal probe that is placed in the vagina, to look at a women's female organs. Transvaginal ultrasound is a method of seeing inside the pelvis of a woman. The ultrasound machine sends out sound waves from the transducer (probe). These sound waves bounce off body structures (like an echo) to create a picture. The picture shows up on a monitor. It is called transvaginal because the probe is inserted into the vagina. There should be very little discomfort from the vaginal probe. This test can also be used during pregnancy. Endovaginal ultrasound is another name for a transvaginal ultrasound. In a transabdominal ultrasound, the probe is placed on the outside of the belly. This method gives pictures that are lower quality than pictures from the transvaginal technique. Transvaginal ultrasound is used to look for problems of the female genital tract. Some such problems include:  Infertility problems.  Congenital (birth defect) malformations of the uterus and ovaries.  Tumors in the uterus.  Abnormal bleeding.  Ovarian tumors and cysts.  Abscess (inflamed tissue around pus) in the pelvis.  Unexplained abdominal or pelvic pain.  Pelvic infection. DURING PREGNANCY, TRANSVAGINAL ULTRASOUND MAY BE USED TO LOOK AT:  Normal pregnancy.  Ectopic pregnancy (pregnancy outside the uterus).  Fetal heartbeat.  Abnormalities in the pelvis, that are not seen well with transabdominal ultrasound.  Suspected twins or multiples.  Impending miscarriage.  Problems with the cervix (incompetent cervix, not able to stay closed and hold the baby).  When doing an amniocentesis (removing fluid from the pregnancy sac, for testing).  Looking for abnormalities of the baby.  Checking the growth, development, and age of the fetus.  Measuring the amount of fluid in the amniotic sac.  When doing an external version of the baby (moving  baby into correct position).  Evaluating the baby for problems in high risk pregnancies (biophysical profile).  Suspected fetal demise (death). Sometimes a special ultrasound method called Saline Infusion Sonography (SIS) is used for a more accurate look at the uterus. Sterile saline (salt water) is injected into the uterus of non-pregnant patients to see the inside of the uterus better. SIS is not used on pregnant women. The vaginal probe can also assist in obtaining biopsies of abnormal areas, in draining fluid from cysts on the ovary, and in finding IUDs (intrauterine device, birth control) that cannot be located. PREPARATION FOR TEST A transvaginal ultrasound is done with the bladder empty. The transabdominal ultrasound is done with your bladder full. You may be asked to drink several glasses of water before that exam. Sometimes, a transabdominal ultrasound is done just after a transvaginal ultrasound, to look at organs in your abdomen. PROCEDURE  You will lie down on a table, with your knees bent and your feet in foot holders. The probe is covered with a condom. A sterile lubricant is put into the vagina and on the probe. The lubricant helps transmit the sound waves and avoid irritating the vagina. Your caregiver will move the probe inside the vaginal cavity to scan the pelvic structures. A normal test will show a normal pelvis and normal contents. An abnormal test will show abnormalities of the pelvis, placenta, or baby. ABNORMAL RESULTS MAY BE DUE TO:  Growths or tumors in the:  Uterus.  Ovaries.  Vagina.  Other pelvic structures.  Non-cancerous growths of the uterus and ovaries.  Twisting of the ovary, cutting off blood supply to the ovary (ovarian torsion).  Areas of infection, including:  Pelvic   inflammatory disease.  Abscess in the pelvis.  Locating an IUD. PROBLEMS FOUND IN PREGNANT WOMEN MAY INCLUDE:  Ectopic pregnancy (pregnancy outside the uterus).  Multiple  pregnancies.  Early dilation (opening) of the cervix. This may indicate an incompetent cervix and early delivery.  Impending miscarriage.  Fetal death.  Problems with the placenta, including:  Placenta has grown over the opening of the womb (placenta previa).  Placenta has separated early in the womb (placental abruption).  Placenta grows into the muscle of the uterus (placenta accreta).  Tumors of pregnancy, including gestational trophoblastic disease. This is an abnormal pregnancy, with no fetus. The uterus is filled with many grape-like cysts that could sometimes be cancerous.  Incorrect position of the fetus (breech, vertex).  Intrauterine fetal growth retardation (IUGR) (poor growth in the womb).  Fetal abnormalities or infection. RISKS AND COMPLICATIONS There are no known risks to the ultrasound procedure. There is no X-ray used when doing an ultrasound. Document Released: 02/27/2004 Document Revised: 06/09/2011 Document Reviewed: 02/14/2009 Exit[employer] Patient Information 2014 [employer]. Endometrial Biopsy Endometrial biopsy is a procedure in which a tissue sample is taken from inside the uterus. The tissue sample is then looked at under a microscope to see if the tissue is normal or abnormal. The endometrium is the lining of the uterus. This procedure helps determine where you are in your menstrual cycle and how hormone levels are affecting the lining of the uterus. This procedure may also be used to evaluate uterine bleeding or to diagnose endometrial cancer, tuberculosis, polyps, or inflammatory conditions.  LET YOUR HEALTH CARE PROVIDER KNOW ABOUT:  Any allergies you have.  All medicines you are taking, including vitamins, herbs, eye drops, creams, and over-the-counter medicines.  Previous problems you or members of your family have had with the use of anesthetics.  Any blood disorders you have.  Previous surgeries you have had.  Medical conditions you  have.  Possibility of pregnancy. RISKS AND COMPLICATIONS Generally, this is a safe procedure. However, as with any procedure, complications can occur. Possible complications include:  Bleeding.  Pelvic infection.  Puncture of the uterine wall with the biopsy device (rare). BEFORE THE PROCEDURE   Keep a record of your menstrual cycles as directed by your health care provider. You may need to schedule your procedure for a specific time in your cycle.  You may want to bring a sanitary pad to wear home after the procedure.  Arrange for someone to drive you home after the procedure if you will be given a medicine to help you relax (sedative). PROCEDURE   You may be given a sedative to relax you.  You will lie on an exam table with your feet and legs supported as in a pelvic exam.  Your health care provider will insert an instrument (speculum) into your vagina to see your cervix.  Your cervix will be cleansed with an antiseptic solution. A medicine (local anesthetic) will be used to numb the cervix.  A forceps instrument (tenaculum) will be used to hold your cervix steady for the biopsy.  A thin, rodlike instrument (uterine sound) will be inserted through your cervix to determine the length of your uterus and the location where the biopsy sample will be removed.  A thin, flexible tube (catheter) will be inserted through your cervix and into the uterus. The catheter is used to collect the biopsy sample from your endometrial tissue.  The catheter and speculum will then be removed, and the tissue sample will be sent to   a lab for examination. AFTER THE PROCEDURE  You will rest in a recovery area until you are ready to go home.  You may have mild cramping and a small amount of vaginal bleeding for a few days after the procedure. This is normal.  Make sure you find out how to get your test results. Document Released: 07/18/2004 Document Revised: 11/17/2012 Document Reviewed:  09/01/2012 Exit[employer] Patient Information 2014 [employer].  

## 2013-02-11 ENCOUNTER — Ambulatory Visit
Admission: RE | Admit: 2013-02-11 | Discharge: 2013-02-11 | Disposition: A | Discharge status: Home / Self Care | Payer: Medicare Other | Source: Ambulatory Visit

## 2013-02-11 DIAGNOSIS — Z1231 Encounter for screening mammogram for malignant neoplasm of breast: Secondary | ICD-10-CM

## 2013-03-02 ENCOUNTER — Other Ambulatory Visit: Payer: Medicare Other

## 2013-03-02 ENCOUNTER — Ambulatory Visit: Payer: Medicare Other | Admitting: Gynecology

## 2013-03-14 ENCOUNTER — Ambulatory Visit (INDEPENDENT_AMBULATORY_CARE_PROVIDER_SITE_OTHER): Payer: Medicare Other | Admitting: Diagnostic Neuroimaging

## 2013-03-14 ENCOUNTER — Encounter: Payer: Self-pay | Admitting: Diagnostic Neuroimaging

## 2013-03-14 VITALS — BP 138/78 | HR 86 | Ht 62.0 in | Wt 192.0 lb

## 2013-03-14 DIAGNOSIS — G20 Parkinson's disease: Secondary | ICD-10-CM

## 2013-03-14 MED ORDER — CARBIDOPA-LEVODOPA 25-100 MG PO TABS: 1.5000 | ORAL_TABLET | Freq: Three times a day (TID) | ORAL | Status: DC

## 2013-03-14 NOTE — Progress Notes (Signed)
[facility]  PATIENT: Barbara Bradshaw DOB: [birth year]  REFERRING CLINICIAN:  HISTORY FROM: patient and husband REASON FOR VISIT: follow up   HISTORICAL  CHIEF COMPLAINT:  Chief Complaint  Patient presents with  . Follow-up    PD    HISTORY OF PRESENT ILLNESS:   UPDATE 03/14/13: Since last visit, doing well. Some wearing off symptoms noted when she wakes up, but not later in the day. Tolerating carb/levo 25/100, 1/5 tabs TID. No falls. She has some swallowing issues (feels food caught in mid-lower esophagus). Some nerve/anxiety issues, but manageable.   UPDATE 08/03/12: since last visit no new changes. Patient's tremor is under good control with her carbidopa/levodopa. Sometimes she forgets to take the midday dose. She takes her medications regularly, it seems to work well. If she misses a dose, she notices wearing off. Patient also reports mild short-term memory loss. No anxiety. Some constipation. I noted some oral-lingual dyskinesias today, which patient does not notice. Husband has noticed this. She attributes this to her ill fitting dentures.  UPDATE 03/08/12: Since last visit, doing better. Increased carb/levo seems to work better. Some off-time in early AM, otherise no waearing off. No side effects. No anxiety, constipation or falls. some swallow diff, but mild.  UPDATE 01/01/12:  She feels like carbidopa-levodopa has helped 15%.  She is tolerating carbidopa-levodopa well without dizziness, she has had occasional nausea.  She takes her midday dose with her meals.  Denies any falls. No change in smell and taste, bowel or bladder.  PRIOR HPI (Dr. Marjory Lies) 67 year old right-handed female with history of diabetes, hypertension, hyperlipidemia, here for evaluation of tremor for the past one year.  Patient noted intermittent tremor when she is doing certain actions such as handwriting, cooking, holding objects. Her husband has noted some intermittent resting tremor.  Patient fell down in June 2012. She feels that her balance and walking have slightly worsened over time. Husband has noticed change in her voice. Patient having a little bit more anxiety, as well as very vivid dreams lately. Patient has family history Parkinson's disease in her sister and her father. No change in sense of smell or taste.   REVIEW OF SYSTEMS: Full 14 system review of systems performed and notable only for chills trouble swallowing dizziness memory loss headache nervousness.   ALLERGIES: No Known Allergies  HOME MEDICATIONS: Outpatient Prescriptions Prior to Visit  Medication Sig Dispense Refill  . aspirin 81 MG tablet Take 81 mg by mouth daily.        Marland Kitchen b complex vitamins capsule Take 1 capsule by mouth daily.      . betamethasone dipropionate (DIPROLENE) 0.05 % cream Apply topically 2 (two) times daily.       . calcium carbonate (OS-CAL) 600 MG TABS Take 600 mg by mouth daily.        . cholecalciferol (VITAMIN D) 1000 UNITS tablet Take 1,000 Units by mouth daily.      . fish oil-omega-3 fatty acids 1000 MG capsule Take 1 g by mouth daily.      . furosemide (LASIX) 40 MG tablet Take 1 tablet (40 mg total) by mouth daily.  90 tablet  3  . glipiZIDE (GLUCOTROL XL) 10 MG 24 hr tablet Take 1 tablet (10 mg total) by mouth daily.  90 tablet  3  . metFORMIN (GLUCOPHAGE) 1000 MG tablet TAKE ONE TABLET BY MOUTH TWICE DAILY  180 tablet  0  . Multiple Vitamin (MULTIVITAMIN) tablet Take 1 tablet by mouth daily.      Marland Kitchen  PARoxetine (PAXIL) 20 MG tablet Take 1 tablet (20 mg total) by mouth daily.  90 tablet  3  . quinapril (ACCUPRIL) 10 MG tablet Take 1 tablet (10 mg total) by mouth daily.  90 tablet  2  . rosuvastatin (CRESTOR) 10 MG tablet Take 1 tablet (10 mg total) by mouth daily.  90 tablet  3  . simethicone (MYLICON) 125 MG chewable tablet Chew 125 mg by mouth every 6 (six) hours as needed.        . vitamin C (ASCORBIC ACID) 500 MG tablet Take 500 mg by mouth daily.      Marland Kitchen zinc  gluconate 50 MG tablet Take 50 mg by mouth daily.      . carbidopa-levodopa (SINEMET IR) 25-100 MG per tablet Take 1.5 tablets by mouth 3 (three) times daily.  180 tablet  12   No facility-administered medications prior to visit.    PAST MEDICAL HISTORY: Past Medical History  Diagnosis Date  . Diabetes mellitus, type 2   . Hypertension   . Rosacea   . Nephrolithiasis   . Hyperlipidemia   . Parkinson's disease 12/2011  . Parkinson disease     PAST SURGICAL HISTORY: Past Surgical History  Procedure Laterality Date  . Tubal ligation    . Right arm fx with orif      FAMILY HISTORY: Family History  Problem Relation Age of Onset  . Dementia Mother   . Cancer Mother     breast  . Heart disease Mother   . Hypertension Mother   . Diabetes Father   . Colon cancer Neg Hx   . Stomach cancer Neg Hx     SOCIAL HISTORY:  History   Social History  . Marital Status: Married    Spouse Name: [name]    Number of Children: 3  . Years of Education: 14   Occupational History  . retired    Social History Main Topics  . Smoking status: Never Smoker   . Smokeless tobacco: Never Used  . Alcohol Use: No  . Drug Use: No  . Sexual Activity: Not Currently   Other Topics Concern  . Not on file   Social History Narrative   HSG, 2 years collegeMarried 1966. 3 sons- 1968, 32, 31. 3 grandchildren. Retired (disability)   Lives with SO in their own home. ACP - no prolonged support on artificial means.      PHYSICAL EXAM  Filed Vitals:   03/14/13 1426  BP: 138/78  Pulse: 86  Height: 5\' 2"  (1.575 m)  Weight: 192 lb (87.091 kg)   Body mass index is 35.11 kg/(m^2).  EXAM: General: Patient is awake, alert and in no acute distress.  Well developed and groomed. MASKED FACIES. Neck: Neck is supple. Cardiovascular: No carotid artery bruits.  Heart is regular rate and rhythm with no murmurs.  Neurologic Exam  Mental Status: Awake, alert. Language is fluent and comprehension  intact.  NEG MYERSONS. NEG SNOUT.  Cranial Nerves: Pupils are equal and reactive to light.  Visual fields are full to confrontation.  Conjugate eye movements are full and symmetric; EXCEPT FOR RIGHT EXO/HYPERTROPIA. Facial sensation and strength are symmetric.  Hearing is intact.  Palate elevated symmetrically and uvula is midline.  Shoulder shrug is symmetric.  Tongue is midline.  Motor: Normal bulk. MILD ACTION/POSTURAL. NO REST TREMOR. BRADYKINESIA IN LUE>RUE.  COGWHEELING IN LUE. Full strength in the upper and lower extremities.  No pronator drift. Sensory: Intact and symmetric to light touch and  temperature Coordination: No ataxia or dysmetria on finger-nose or rapid alternating movement testing. Gait and Station: STOOPED POSTURE. MODERATE ARM SWING.  Reflexes: Deep tendon reflexes in the upper and lower extremity are present and symmetric; TRACE AT ANKLES.   DIAGNOSTIC DATA (LABS, IMAGING, TESTING) - I reviewed patient records, labs, notes, testing and imaging myself where available.  Lab Results  Component Value Date   WBC 9.1 10/21/2011   HGB 13.1 10/21/2011   HCT 39.8 10/21/2011   MCV 87.4 10/21/2011   PLT 136.0* 10/21/2011      Component Value Date/Time   NA 141 05/20/2012 1645   K 4.3 05/20/2012 1645   CL 102 05/20/2012 1645   CO2 32 05/20/2012 1645   GLUCOSE 168* 05/20/2012 1645   BUN 15 05/20/2012 1645   CREATININE 1.0 05/20/2012 1645   CALCIUM 10.3 05/20/2012 1645   PROT 7.5 05/20/2012 1645   ALBUMIN 4.5 05/20/2012 1645   AST 24 05/20/2012 1645   ALT 10 05/20/2012 1645   ALKPHOS 74 05/20/2012 1645   BILITOT 0.6 05/20/2012 1645   GFRNONAA 65.12 01/14/2010 1526   GFRAA 109 02/15/2008 1323   Lab Results  Component Value Date   CHOL 94 10/21/2011   HDL 46.20 10/21/2011   LDLCALC 25 10/21/2011   LDLDIRECT 40.1 03/09/2007   TRIG 114.0 10/21/2011   CHOLHDL 2 10/21/2011   Lab Results  Component Value Date   HGBA1C 7.1* 05/20/2012   Lab Results  Component Value Date   VITAMINB12 546  10/21/2011   Lab Results  Component Value Date   TSH 0.31* 10/21/2011   11/19/11 MRI of brain showed multiple round and ovoid, periventricular, subcortical, thalamic and pontine foci of T2 hyperintensity likely representative of chronic small vessel ischemic disease.   ASSESSMENT AND PLAN  67 y.o. year-old female with progressive mixed tremor (action and postural greater than resting), bradykinesia, cogwheeling rigidity, postural instability. She has 4 of the 4 cardinal features for Parkinson's disease. Doing well on carb/levo.  Dx: idiopathic parkinson's disease  PLAN: 1. continue carbidopa-levodopa 1.5tabs TID 2. may consider azilect or dopamine agonist in future 3. Consider GI evaluation for possible esophageal stricture  Return in about 6 months (around 09/12/2013) for with Heide Guile or Arietta Eisenstein.     Suanne Marker, MD 03/14/2013, 2:51 PM Certified in Neurology, Neurophysiology and Neuroimaging  [facility] [address] [place], [place] [address] [phone]

## 2013-03-14 NOTE — Patient Instructions (Signed)
Continue current medications. 

## 2013-04-08 ENCOUNTER — Other Ambulatory Visit: Payer: Self-pay | Admitting: *Deleted

## 2013-04-08 ENCOUNTER — Telehealth: Payer: Self-pay | Admitting: Internal Medicine

## 2013-04-08 MED ORDER — ONETOUCH ULTRASOFT LANCETS MISC: Status: AC

## 2013-04-08 NOTE — Telephone Encounter (Signed)
[facility], sent to Dr. Debby BudNorins on 04/08/13/ss.

## 2013-04-08 NOTE — Telephone Encounter (Signed)
Pharmacist phoned requesting refills for pt's lancets.  Pt's last OV with PCP 11/22/12 with an upcoming scheduled 05/23/13.  Per refill protocol, lancets refilled with [facility]

## 2013-04-13 ENCOUNTER — Encounter: Payer: Self-pay | Admitting: Internal Medicine

## 2013-04-13 ENCOUNTER — Ambulatory Visit (INDEPENDENT_AMBULATORY_CARE_PROVIDER_SITE_OTHER): Payer: Medicare Other | Admitting: Internal Medicine

## 2013-04-13 VITALS — BP 140/80 | HR 84 | Temp 96.9°F | Wt 186.8 lb

## 2013-04-13 DIAGNOSIS — E119 Type 2 diabetes mellitus without complications: Secondary | ICD-10-CM

## 2013-04-13 DIAGNOSIS — E785 Hyperlipidemia, unspecified: Secondary | ICD-10-CM

## 2013-04-13 DIAGNOSIS — F329 Major depressive disorder, single episode, unspecified: Secondary | ICD-10-CM

## 2013-04-13 DIAGNOSIS — I10 Essential (primary) hypertension: Secondary | ICD-10-CM

## 2013-04-13 DIAGNOSIS — B9789 Other viral agents as the cause of diseases classified elsewhere: Secondary | ICD-10-CM

## 2013-04-13 DIAGNOSIS — J069 Acute upper respiratory infection, unspecified: Secondary | ICD-10-CM

## 2013-04-13 MED ORDER — TRAZODONE HCL 50 MG PO TABS: 50.0000 mg | ORAL_TABLET | Freq: Every day | ORAL | Status: DC

## 2013-04-13 MED ORDER — ATORVASTATIN CALCIUM 40 MG PO TABS: 40.0000 mg | ORAL_TABLET | Freq: Every day | ORAL | Status: DC

## 2013-04-13 MED ORDER — BUPROPION HCL ER (XL) 150 MG PO TB24: 150.0000 mg | ORAL_TABLET | Freq: Every day | ORAL | Status: DC

## 2013-04-13 NOTE — Assessment & Plan Note (Signed)
Lab Results  Component Value Date   HGBA1C 7.1* 05/20/2012   Due for follow up A1C with recommendations to follow.

## 2013-04-13 NOTE — Patient Instructions (Signed)
1. Viral URI - no sign on exam of a bacterial infection. You have already had a very powerful antibiotic Plan  Supportive: fluids, vitamin C 1500 mg daily, bland diet, rest  For cough - sugar robitussin DM or Delsym  2. Headache - does not sound like a migraine or a nervous system problem.  Plan Continue aleve - twice a day to help with the headache  Tylenol 1000 mg three times a day for headache pain.  3. Depression - you have many symptoms of depression. It may that the Paxil is not working [employer]man KodakPlan Start wellbutrin XL 150 mg every AM,  After 5 days of wellbutrin start tapering off the paxil - got to every other day for a week and then stop  Trazodone 50 mg at bedtime for both sleep and to augment treatment for depression  4. Diabetes - due for lab - come in in four weeks  5. High cholesterol - will change from crestor 10 mg to generic lipitor 40 mg once a day/PM Plan Return for lab work in 4 weeks.   6. Parkinson's disease - this is the villain in regard to gait, balance and a sensation of "going to fall."

## 2013-04-13 NOTE — Assessment & Plan Note (Signed)
BP Readings from Last 3 Encounters:  04/13/13 140/80  03/14/13 138/78  02/10/13 128/80   OK control on present medication - no change at this time

## 2013-04-13 NOTE — Assessment & Plan Note (Signed)
Very good control on crestor with last LDL < 70. Due to $$$$-coverage needs to change  Plan Change to atorvastatin 40 mg once a day  F/u lab 3- weeks.

## 2013-04-13 NOTE — Assessment & Plan Note (Signed)
Depression - you have many symptoms of depression. It may that the Paxil is not working [employer]man KodakPlan Start wellbutrin XL 150 mg every AM,  After 5 days of wellbutrin start tapering off the paxil - got to every other day for a week and then stop  Trazodone 50 mg at bedtime for both sleep and to augment treatment for depression

## 2013-04-13 NOTE — Progress Notes (Signed)
Subjective:    Patient ID: Barbara Bradshaw, female    DOB: [birth year], 68 y.o.   MRN: [id number]  HPI Barbara Bradshaw reports she has had a "bug" which she caught in [place]: rhinorrhea, myalgias, cough - productive of clear mucus, she has not slept due to cough. No fever, she has had rigors vs Parkinson's tremor. She was seen at [facility]. No testing. She was diagnosed with a sinusitis and was treated with 7 day course of Augmentin 875 mg bid. She saw minimal improvement in her symptoms. She has not been taking any antitussive agents.   She has had headache since she has had the above symptoms. The pain is intermittent. Headache pain does not awaken her. No visual change, no paresthesia, no focal weakness.  Insomnia - not cough, not grief. Sad most of the time, anhedonia, irritable, no appetite, changed sleep pattern, perseveration/worry, feels hopeless/helpless.  Past Medical History  Diagnosis Date  . Diabetes mellitus, type 2   . Hypertension   . Rosacea   . Nephrolithiasis   . Hyperlipidemia   . Parkinson's disease 12/2011  . Parkinson disease    Past Surgical History  Procedure Laterality Date  . Tubal ligation    . Right arm fx with orif     Family History  Problem Relation Age of Onset  . Dementia Mother   . Cancer Mother     breast  . Heart disease Mother   . Hypertension Mother   . Diabetes Father   . Colon cancer Neg Hx   . Stomach cancer Neg Hx    History   Social History  . Marital Status: Married    Spouse Name: [name]    Number of Children: 3  . Years of Education: 14   Occupational History  . retired    Social History Main Topics  . Smoking status: Never Smoker   . Smokeless tobacco: Never Used  . Alcohol Use: No  . Drug Use: No  . Sexual Activity: Not Currently   Other Topics Concern  . Not on file   Social History Narrative   HSG, 2 years collegeMarried 1966. 3 sons- 1968, [id number], [id number]. 3 grandchildren. Retired (disability)     Lives with SO in their own home. ACP - no prolonged support on artificial means.     Current Outpatient Prescriptions on File Prior to Visit  Medication Sig Dispense Refill  . aspirin 81 MG tablet Take 81 mg by mouth daily.        Marland Kitchen. b complex vitamins capsule Take 1 capsule by mouth daily.      . betamethasone dipropionate (DIPROLENE) 0.05 % cream Apply topically 2 (two) times daily.       . calcium carbonate (OS-CAL) 600 MG TABS Take 600 mg by mouth daily.        . carbidopa-levodopa (SINEMET IR) 25-100 MG per tablet Take 1.5 tablets by mouth 3 (three) times daily.  450 tablet  4  . cholecalciferol (VITAMIN D) 1000 UNITS tablet Take 1,000 Units by mouth daily.      . fish oil-omega-3 fatty acids 1000 MG capsule Take 1 g by mouth daily.      . furosemide (LASIX) 40 MG tablet Take 1 tablet (40 mg total) by mouth daily.  90 tablet  3  . glipiZIDE (GLUCOTROL XL) 10 MG 24 hr tablet Take 1 tablet (10 mg total) by mouth daily.  90 tablet  3  . Lancets (ONETOUCH ULTRASOFT)  lancets Use as instructed  100 each  12  . metFORMIN (GLUCOPHAGE) 1000 MG tablet TAKE ONE TABLET BY MOUTH TWICE DAILY  180 tablet  0  . Multiple Vitamin (MULTIVITAMIN) tablet Take 1 tablet by mouth daily.      . ONE TOUCH ULTRA TEST test strip       . PARoxetine (PAXIL) 20 MG tablet Take 1 tablet (20 mg total) by mouth daily.  90 tablet  3  . quinapril (ACCUPRIL) 10 MG tablet Take 1 tablet (10 mg total) by mouth daily.  90 tablet  2  . rosuvastatin (CRESTOR) 10 MG tablet Take 1 tablet (10 mg total) by mouth daily.  90 tablet  3  . simethicone (MYLICON) 125 MG chewable tablet Chew 125 mg by mouth every 6 (six) hours as needed.        . vitamin C (ASCORBIC ACID) 500 MG tablet Take 500 mg by mouth daily.      Marland Kitchen zinc gluconate 50 MG tablet Take 50 mg by mouth daily.       No current facility-administered medications on file prior to visit.     Review of Systems System review is negative for any constitutional, cardiac,  pulmonary, GI or neuro symptoms or complaints other than as described in the HPI.     Objective:   Physical Exam Filed Vitals:   04/13/13 0825  BP: 140/80  Pulse: 84  Temp: 96.9 F (36.1 C)   BP Readings from Last 3 Encounters:  04/13/13 140/80  03/14/13 138/78  02/10/13 128/80   Wt Readings from Last 3 Encounters:  04/13/13 186 lb 12.8 oz (84.732 kg)  03/14/13 192 lb (87.091 kg)  02/10/13 190 lb (86.183 kg)   Gen'l - overweight woman in no acute distress HEENT - TMs normal, throat clear Nodes - negative Cor - 2+ radial, RRR Pulm - CTAP Neuro - Awake and alert Psyc -flat affect.        Assessment & Plan:  1. Viral URI - no sign on exam of a bacterial infection. You have already had a very powerful antibiotic Plan  Supportive: fluids, vitamin C 1500 mg daily, bland diet, rest  For cough - sugar robitussin DM or Delsym  2. Headache - does not sound like a migraine or a nervous system problem.  Plan Continue aleve - twice a day to help with the headache  Tylenol 1000 mg three times a day for headache pain.

## 2013-04-13 NOTE — Progress Notes (Signed)
Pre visit review using our clinic review tool, if applicable. No additional management support is needed unless otherwise documented below in the visit note. 

## 2013-04-14 ENCOUNTER — Telehealth: Payer: Self-pay

## 2013-04-14 NOTE — Telephone Encounter (Signed)
Relevant patient education assigned to patient using Emmi. ° °

## 2013-04-18 ENCOUNTER — Telehealth: Payer: Self-pay | Admitting: Diagnostic Neuroimaging

## 2013-04-18 NOTE — Telephone Encounter (Signed)
Patient called to state that she is still jerking a lot and is wondering if she needs to increase her dosage of Carbidopa-Levodopa. Patient mentioned a tragic event in her family in the middle of December concerning her mother being placed on life support. Please call the patient and advise.

## 2013-04-18 NOTE — Telephone Encounter (Signed)
Please advise 

## 2013-04-28 ENCOUNTER — Other Ambulatory Visit: Payer: Self-pay | Admitting: Internal Medicine

## 2013-05-04 ENCOUNTER — Ambulatory Visit (INDEPENDENT_AMBULATORY_CARE_PROVIDER_SITE_OTHER): Payer: Medicare Other | Admitting: Internal Medicine

## 2013-05-04 ENCOUNTER — Encounter: Payer: Self-pay | Admitting: Internal Medicine

## 2013-05-04 VITALS — BP 128/70 | HR 81 | Temp 97.3°F | Wt 178.8 lb

## 2013-05-04 DIAGNOSIS — I10 Essential (primary) hypertension: Secondary | ICD-10-CM

## 2013-05-04 DIAGNOSIS — F329 Major depressive disorder, single episode, unspecified: Secondary | ICD-10-CM

## 2013-05-04 DIAGNOSIS — K5900 Constipation, unspecified: Secondary | ICD-10-CM | POA: Insufficient documentation

## 2013-05-04 NOTE — Patient Instructions (Addendum)
It has been a pleasure providing medical care for you.  For constipation: -Try milk of magnesia, one capful every four hours until bowel movement. - For maintenance, try metamucil or other bulk-forming agent.  For anxiety: -continue buproprion 150mg   For nasal drip: - it will resolve on its own, but if it continues to bother you, try Claritin.   Constipation, Adult Constipation is when a person has fewer than 3 bowel movements a week; has difficulty having a bowel movement; or has stools that are dry, hard, or larger than normal. As people grow older, constipation is more common. If you try to fix constipation with medicines that make you have a bowel movement (laxatives), the problem may get worse. Long-term laxative use may cause the muscles of the colon to become weak. A low-fiber diet, not taking in enough fluids, and taking certain medicines may make constipation worse. CAUSES   Certain medicines, such as antidepressants, pain medicine, iron supplements, antacids, and water pills.   Certain diseases, such as diabetes, irritable bowel syndrome (IBS), thyroid disease, or depression.   Not drinking enough water.   Not eating enough fiber-rich foods.   Stress or travel.  Lack of physical activity or exercise.  Not going to the restroom when there is the urge to have a bowel movement.  Ignoring the urge to have a bowel movement.  Using laxatives too much. SYMPTOMS   Having fewer than 3 bowel movements a week.   Straining to have a bowel movement.   Having hard, dry, or larger than normal stools.   Feeling full or bloated.   Pain in the lower abdomen.  Not feeling relief after having a bowel movement. DIAGNOSIS  Your caregiver will take a medical history and perform a physical exam. Further testing may be done for severe constipation. Some tests may include:   A barium enema X-ray to examine your rectum, colon, and sometimes, your small intestine.  A  sigmoidoscopy to examine your lower colon.  A colonoscopy to examine your entire colon. TREATMENT  Treatment will depend on the severity of your constipation and what is causing it. Some dietary treatments include drinking more fluids and eating more fiber-rich foods. Lifestyle treatments may include regular exercise. If these diet and lifestyle recommendations do not help, your caregiver may recommend taking over-the-counter laxative medicines to help you have bowel movements. Prescription medicines may be prescribed if over-the-counter medicines do not work.  HOME CARE INSTRUCTIONS   Increase dietary fiber in your diet, such as fruits, vegetables, whole grains, and beans. Limit high-fat and processed sugars in your diet, such as [place] fries, hamburgers, cookies, candies, and soda.   A fiber supplement may be added to your diet if you cannot get enough fiber from foods.   Drink enough fluids to keep your urine clear or pale yellow.   Exercise regularly or as directed by your caregiver.   Go to the restroom when you have the urge to go. Do not hold it.  Only take medicines as directed by your caregiver. Do not take other medicines for constipation without talking to your caregiver first. SEEK IMMEDIATE MEDICAL CARE IF:   You have bright red blood in your stool.   Your constipation lasts for more than 4 days or gets worse.   You have abdominal or rectal pain.   You have thin, pencil-like stools.  You have unexplained weight loss. MAKE SURE YOU:   Understand these instructions.  Will watch your condition.  Will get help  right away if you are not doing well or get worse. Document Released: 12/14/2003 Document Revised: 06/09/2011 Document Reviewed: 12/27/2012 [facility] Patient Information 2014 [place], [place].

## 2013-05-04 NOTE — Assessment & Plan Note (Addendum)
Patient experienced loss of her mother at the end of Jan '15. Does not endorse SI.   Plan Continue present medications

## 2013-05-04 NOTE — Progress Notes (Signed)
Pre visit review using our clinic review tool, if applicable. No additional management support is needed unless otherwise documented below in the visit note. 

## 2013-05-04 NOTE — Progress Notes (Signed)
Subjective:     Patient ID: Barbara Bradshaw, female   DOB: 01-20-1946, 68 y.o.   MRN: [id number]  HPI  Saw Dr. Debby Bud three weeks ago with c/o "bug," antibiotics given and chest congestion has cleared. Still having nasal congestion and throat congestion. Denies changes in headaches.  Interval history: Constipation - one bowel movement in the last several weeks. Anxiety - loss of mother at the end of January '15  Past Medical History  Diagnosis Date  . Diabetes mellitus, type 2   . Hypertension   . Rosacea   . Nephrolithiasis   . Hyperlipidemia   . Parkinson's disease 12/2011  . Parkinson disease    Past Surgical History  Procedure Laterality Date  . Tubal ligation    . Right arm fx with orif     Family History  Problem Relation Age of Onset  . Dementia Mother   . Cancer Mother     breast  . Heart disease Mother   . Hypertension Mother   . Diabetes Father   . Colon cancer Neg Hx   . Stomach cancer Neg Hx    History   Social History  . Marital Status: Married    Spouse Name: [name]    Number of Children: 3  . Years of Education: 14   Occupational History  . retired    Social History Main Topics  . Smoking status: Never Smoker   . Smokeless tobacco: Never Used  . Alcohol Use: No  . Drug Use: No  . Sexual Activity: Not Currently   Other Topics Concern  . Not on file   Social History Narrative   HSG, 2 years collegeMarried 1966. 3 sons- 1968, 26, 61. 3 grandchildren. Retired (disability)   Lives with SO in their own home. ACP - no prolonged support on artificial means.      Review of Systems Constitutional:  Negative for fever, chills, activity change and unexpected weight change.  HEENT:  Negative for ear pain, neck stiffness, sore throat, endorses postnasal drip and swallowing problems..   Eyes: Negative for vision loss or change in visual acuity.  Respiratory: Negative for chest tightness and wheezing. Negative for DOE.   Cardiovascular: Negative  for chest pain or palpitations. No decreased exercise tolerance Gastrointestinal: Endorses constipation. No bloating or gas. No reflux or indigestion Genitourinary: Negative for urgency, frequency, flank pain and difficulty urinating.  Musculoskeletal: Negative for myalgias, back pain, arthralgias and gait problem.  Neurological: Endorses tremor, dizziness, and headaches due to Parkinson's. No change in last 3 weeks  Psychiatric/Behavioral: Negative for behavioral problems, endorses a depressed mood due to several family traumas in the last six months.       Objective:   Physical Exam Filed Vitals:   05/04/13 0837  BP: 128/70  Pulse: 81  Temp: 97.3 F (36.3 C)    General: Well developed, well nourished, NAD, appears stated age HEENT: NCAT, PERRLA, EOMI, Anicteic Sclera, mucous membranes moist.  Neck: Supple, no JVD, no masses  Cardiovascular: S1 S2 auscultated, no rubs, murmurs or gallops. Regular rate and rhythm.  Respiratory: Clear to auscultation bilaterally with equal chest rise  Abdomen: Soft, nontender, nondistended, + bowel sounds  Extremities: warm, dry without cyanosis, clubbing, or edema  Neuro: Alert and Oriented x3, cranial nerves II-XII grossly intact.  Skin: Without rashes, exudates, or nodules  Psych: Normal mood and affect with intact judgement and insight    Medication List       This list is accurate as  of: 05/04/13  8:39 AM.  Always use your most recent med list.               aspirin 81 MG tablet  Take 81 mg by mouth daily.     atorvastatin 40 MG tablet  Commonly known as:  LIPITOR  Take 1 tablet (40 mg total) by mouth daily.     b complex vitamins capsule  Take 1 capsule by mouth daily.     betamethasone dipropionate 0.05 % cream  Commonly known as:  DIPROLENE  Apply topically 2 (two) times daily.     buPROPion 150 MG 24 hr tablet  Commonly known as:  WELLBUTRIN XL  Take 1 tablet (150 mg total) by mouth daily.     calcium carbonate 600 MG  Tabs tablet  Commonly known as:  OS-CAL  Take 600 mg by mouth daily.     carbidopa-levodopa 25-100 MG per tablet  Commonly known as:  SINEMET IR  Take 1.5 tablets by mouth 3 (three) times daily.     cholecalciferol 1000 UNITS tablet  Commonly known as:  VITAMIN D  Take 1,000 Units by mouth daily.     fish oil-omega-3 fatty acids 1000 MG capsule  Take 1 g by mouth daily.     furosemide 40 MG tablet  Commonly known as:  LASIX  Take 1 tablet (40 mg total) by mouth daily.     glipiZIDE 10 MG 24 hr tablet  Commonly known as:  GLUCOTROL XL  Take 1 tablet (10 mg total) by mouth daily.     metFORMIN 1000 MG tablet  Commonly known as:  GLUCOPHAGE  TAKE 1 TABLET BY MOUTH TWICE DAILY     multivitamin tablet  Take 1 tablet by mouth daily.     ONE TOUCH ULTRA TEST test strip  Generic drug:  glucose blood     onetouch ultrasoft lancets  Use as instructed     quinapril 10 MG tablet  Commonly known as:  ACCUPRIL  Take 1 tablet (10 mg total) by mouth daily.     simethicone 125 MG chewable tablet  Commonly known as:  MYLICON  Chew 125 mg by mouth every 6 (six) hours as needed.     traZODone 50 MG tablet  Commonly known as:  DESYREL  Take 1 tablet (50 mg total) by mouth at bedtime.     vitamin C 500 MG tablet  Commonly known as:  ASCORBIC ACID  Take 500 mg by mouth daily.     zinc gluconate 50 MG tablet  Take 50 mg by mouth daily.          Assessment:     See problem list      Plan:     Rhinitis - If nasal drip does not resolve on its own, try Claritin or any other non-sedating antihistamine. Look forward to seeing you for physical at the end of the month.

## 2013-05-05 NOTE — Assessment & Plan Note (Signed)
Trial of milk of magnesia - one cap every 4 hours until BM. If this doesn't work, try lactulose. To keep regular, try a bulk laxative like metamucil.

## 2013-05-05 NOTE — Assessment & Plan Note (Signed)
BP Readings from Last 3 Encounters:  05/04/13 128/70  04/13/13 140/80  03/14/13 138/78   Stable on current medication - will continue the same

## 2013-05-09 ENCOUNTER — Telehealth: Payer: Self-pay | Admitting: Diagnostic Neuroimaging

## 2013-05-09 NOTE — Telephone Encounter (Signed)
Patient wants to know if there is a [place] support [place]

## 2013-05-10 NOTE — Telephone Encounter (Signed)
Called patient and left message concerning the information about Parkinson's Disease Support Groups. I gave the patient the [phone] number to call and get a support group that is closer to her. I advised the patient that if she has any other problems, questions or concerns to call the office.

## 2013-05-17 NOTE — Telephone Encounter (Signed)
Please check on patient. Offer closer follow up visit with me or Larita FifeLynn if needed. -VRP

## 2013-05-17 NOTE — Telephone Encounter (Signed)
Spoke to patient. Having increased sx(s). Scheduled earlier appt w/ Dr. [name] per patient's request.

## 2013-05-23 ENCOUNTER — Other Ambulatory Visit (INDEPENDENT_AMBULATORY_CARE_PROVIDER_SITE_OTHER): Payer: Medicare Other

## 2013-05-23 ENCOUNTER — Other Ambulatory Visit: Payer: Medicare Other

## 2013-05-23 ENCOUNTER — Ambulatory Visit (INDEPENDENT_AMBULATORY_CARE_PROVIDER_SITE_OTHER): Payer: Medicare Other | Admitting: Internal Medicine

## 2013-05-23 ENCOUNTER — Encounter: Payer: Self-pay | Admitting: Internal Medicine

## 2013-05-23 VITALS — BP 110/66 | HR 84 | Temp 98.4°F | Ht 64.0 in | Wt 176.4 lb

## 2013-05-23 DIAGNOSIS — K3189 Other diseases of stomach and duodenum: Secondary | ICD-10-CM

## 2013-05-23 DIAGNOSIS — I10 Essential (primary) hypertension: Secondary | ICD-10-CM

## 2013-05-23 DIAGNOSIS — F329 Major depressive disorder, single episode, unspecified: Secondary | ICD-10-CM

## 2013-05-23 DIAGNOSIS — E669 Obesity, unspecified: Secondary | ICD-10-CM

## 2013-05-23 DIAGNOSIS — Z23 Encounter for immunization: Secondary | ICD-10-CM

## 2013-05-23 DIAGNOSIS — E119 Type 2 diabetes mellitus without complications: Secondary | ICD-10-CM

## 2013-05-23 DIAGNOSIS — G20A1 Parkinson's disease without dyskinesia, without mention of fluctuations: Secondary | ICD-10-CM

## 2013-05-23 DIAGNOSIS — R1319 Other dysphagia: Secondary | ICD-10-CM

## 2013-05-23 DIAGNOSIS — R1013 Epigastric pain: Secondary | ICD-10-CM

## 2013-05-23 DIAGNOSIS — Z0000 Encounter for general adult medical examination without abnormal findings: Secondary | ICD-10-CM

## 2013-05-23 DIAGNOSIS — E66811 Obesity, class 1: Secondary | ICD-10-CM

## 2013-05-23 DIAGNOSIS — G20 Parkinson's disease: Secondary | ICD-10-CM

## 2013-05-23 DIAGNOSIS — E785 Hyperlipidemia, unspecified: Secondary | ICD-10-CM

## 2013-05-23 LAB — COMPREHENSIVE METABOLIC PANEL WITH GFR
ALT: 3 U/L (ref 0–35)
AST: 19 U/L (ref 0–37)
Albumin: 4.2 g/dL (ref 3.5–5.2)
Alkaline Phosphatase: 61 U/L (ref 39–117)
BUN: 14 mg/dL (ref 6–23)
CO2: 30 meq/L (ref 19–32)
Calcium: 10 mg/dL (ref 8.4–10.5)
Chloride: 101 meq/L (ref 96–112)
Creatinine, Ser: 1.3 mg/dL — ABNORMAL HIGH (ref 0.4–1.2)
GFR: 45.25 mL/min — ABNORMAL LOW
Glucose, Bld: 173 mg/dL — ABNORMAL HIGH (ref 70–99)
Potassium: 3.8 meq/L (ref 3.5–5.1)
Sodium: 137 meq/L (ref 135–145)
Total Bilirubin: 0.8 mg/dL (ref 0.3–1.2)
Total Protein: 7.1 g/dL (ref 6.0–8.3)

## 2013-05-23 LAB — LIPID PANEL
CHOL/HDL RATIO: 2
Cholesterol: 87 mg/dL (ref 0–200)
HDL: 43.5 mg/dL (ref >39.00)
LDL CALC: 26 mg/dL (ref 0–99)
TRIGLYCERIDES: 89 mg/dL (ref 0.0–149.0)
VLDL: 17.8 mg/dL (ref 0.0–40.0)

## 2013-05-23 LAB — HEMOGLOBIN AND HEMATOCRIT, BLOOD
HCT: 42.6 % (ref 36.0–46.0)
Hemoglobin: 13.9 g/dL (ref 12.0–15.0)

## 2013-05-23 LAB — HEMOGLOBIN A1C: HEMOGLOBIN A1C: 6.8 % — AB (ref 4.6–6.5)

## 2013-05-23 NOTE — Progress Notes (Signed)
Subjective:    Patient ID: Barbara Bradshaw, female    DOB: 1945-05-17, 68 y.o.   MRN: [id number]  HPI The patient is here for annual Medicare wellness examination and management of other chronic and acute problems.   The risk factors are reflected in the social history.  The roster of all physicians providing medical care to patient - is listed in the Snapshot section of the chart.  Activities of daily living:  The patient is 100% inedpendent in all ADLs: dressing, toileting, feeding except dependent for for mobility - she doesn't drive.   Home safety : The patient has smoke detectors in the home. Falls - none. They wear seatbelts. No firearms at home. There is no violence in the home.   There is no risks for hepatitis, STDs or HIV. There is no   history of blood transfusion. They have no travel history to infectious disease endemic areas of the world.  The patient has notseen their dentist in the last six month - has full dentures but needs to be resized. They have not seen their eye doctor in the last year. They deny any hearing difficulty and have not had audiologic testing in the last year.    They do not  have excessive sun exposure. Discussed the need for sun protection: hats, long sleeves and use of sunscreen if there is significant sun exposure.   Diet: the importance of a healthy diet is discussed. They have a health diet but are not eating much due to depression and dysphagia. She has to force regurgitation at times.   The patient has no regular exercise program.  The benefits of regular aerobic exercise were discussed.  Depression screen: there are signs of vegative symptoms of depression- irritability, change in appetite, anhedonia, sadness/tearfullness.  Cognitive assessment: the patient does not manage all their financial and personal affairs and is actively engaged. They could relate day,date,year and events; recalled 2/3 objects at 3 minutes; performed clock-face test  normally-with spacing issues.  The following portions of the patient's history were reviewed and updated as appropriate: allergies, current medications, past family history, past medical history,  past surgical history, past social history  and problem list.  Vision, hearing, body mass index were assessed and reviewed.   During the course of the visit the patient was educated and counseled about appropriate screening and preventive services including : fall prevention , diabetes screening, nutrition counseling, colorectal cancer screening, and recommended immunizations.  Past Medical History  Diagnosis Date  . Diabetes mellitus, type 2   . Hypertension   . Rosacea   . Nephrolithiasis   . Hyperlipidemia   . Parkinson's disease 12/2011  . Parkinson disease    Past Surgical History  Procedure Laterality Date  . Tubal ligation    . Right arm fx with orif     Family History  Problem Relation Age of Onset  . Dementia Mother   . Cancer Mother     breast  . Heart disease Mother   . Hypertension Mother   . Diabetes Father   . Colon cancer Neg Hx   . Stomach cancer Neg Hx    History   Social History  . Marital Status: Married    Spouse Name: [name]    Number of Children: 3  . Years of Education: 14   Occupational History  . retired    Social History Main Topics  . Smoking status: Never Smoker   . Smokeless tobacco: Never Used  .  Alcohol Use: No  . Drug Use: No  . Sexual Activity: Not Currently   Other Topics Concern  . Not on file   Social History Narrative   HSG, 2 years collegeMarried 1966. 3 sons- 1968, 92, 42. 3 grandchildren. Retired (disability)   Lives with SO in their own home. ACP - no prolonged support on artificial means.     Current Outpatient Prescriptions on File Prior to Visit  Medication Sig Dispense Refill  . aspirin 81 MG tablet Take 81 mg by mouth daily.        Marland Kitchen atorvastatin (LIPITOR) 40 MG tablet Take 1 tablet (40 mg total) by mouth daily.   30 tablet  5  . b complex vitamins capsule Take 1 capsule by mouth daily.      . betamethasone dipropionate (DIPROLENE) 0.05 % cream Apply topically 2 (two) times daily.       Marland Kitchen buPROPion (WELLBUTRIN XL) 150 MG 24 hr tablet Take 1 tablet (150 mg total) by mouth daily.  30 tablet  5  . calcium carbonate (OS-CAL) 600 MG TABS Take 600 mg by mouth daily.        . carbidopa-levodopa (SINEMET IR) 25-100 MG per tablet Take 1.5 tablets by mouth 3 (three) times daily.  450 tablet  4  . cholecalciferol (VITAMIN D) 1000 UNITS tablet Take 1,000 Units by mouth daily.      . fish oil-omega-3 fatty acids 1000 MG capsule Take 1 g by mouth daily.      . furosemide (LASIX) 40 MG tablet Take 1 tablet (40 mg total) by mouth daily.  90 tablet  3  . glipiZIDE (GLUCOTROL XL) 10 MG 24 hr tablet Take 1 tablet (10 mg total) by mouth daily.  90 tablet  3  . Lancets (ONETOUCH ULTRASOFT) lancets Use as instructed  100 each  12  . metFORMIN (GLUCOPHAGE) 1000 MG tablet TAKE 1 TABLET BY MOUTH TWICE DAILY  180 tablet  3  . Multiple Vitamin (MULTIVITAMIN) tablet Take 1 tablet by mouth daily.      . ONE TOUCH ULTRA TEST test strip       . quinapril (ACCUPRIL) 10 MG tablet Take 1 tablet (10 mg total) by mouth daily.  90 tablet  2  . simethicone (MYLICON) 125 MG chewable tablet Chew 125 mg by mouth every 6 (six) hours as needed.        . traZODone (DESYREL) 50 MG tablet Take 1 tablet (50 mg total) by mouth at bedtime.  30 tablet  5  . vitamin C (ASCORBIC ACID) 500 MG tablet Take 500 mg by mouth daily.      Marland Kitchen zinc gluconate 50 MG tablet Take 50 mg by mouth daily.       No current facility-administered medications on file prior to visit.     Review of Systems Constitutional:  Negative for fever, chills, activity change and unexpected weight change.  HEENT:  Negative for hearing loss, ear pain, congestion, neck stiffness and postnasal drip. Negative for sore throat or swallowing problems. Negative for dental complaints.   Eyes:  Negative for vision loss or change in visual acuity.  Respiratory: Negative for chest tightness and wheezing. Positive for DOE.   Cardiovascular: Negative for chest pain or palpitations. Gastrointestinal: No change in bowel habit. No bloating or gas. No reflux or indigestion. Has solid food dysphagia Genitourinary: Negative for urgency, frequency, flank pain and difficulty urinating.  Musculoskeletal: Negative for myalgias, back pain, arthralgias and gait problem.  Neurological: Negative  for dizziness, tremors, weakness and headaches.  Hematological: Negative for adenopathy.  Psychiatric/Behavioral: Positive for behavioral problems and dysphoric mood.       Objective:   Physical Exam Filed Vitals:   05/23/13 0910  BP: 110/66  Pulse: 84  Temp: 98.4 F (36.9 C)   Wt Readings from Last 3 Encounters:  05/23/13 176 lb 6.4 oz (80.015 kg)  05/04/13 178 lb 12.8 oz (81.103 kg)  04/13/13 186 lb 12.8 oz (84.732 kg)   Gen'l: well nourished, well developed, overweight Woman in no distress. She has had weight loss about the head and neck. She is emotionally labile at today's exam.  HEENT - [place]/AT, EACs/TMs normal, oropharynx with full dentures, no buccal lesions, posterior pharynx clear, mucous membranes moist. C&S clear, PERRLA, fundi - normal Neck - supple, no thyromegaly Nodes- negative submental, cervical, supraclavicular regions Chest - no deformity, no CVAT Lungs - clear without rales, wheezes. No increased work of breathing Breast - deferred to mammography Cardiovascular - regular rate and rhythm, quiet precordium, no murmurs, rubs or gallops, 2+ radial, DP and PT pulses Abdomen - BS+ x 4, no HSM, no guarding or rebound or tenderness Pelvic - deferred to gyn Rectal - deferred to gyn Extremities - no clubbing, cyanosis, edema or deformity.  Neuro - A&O x 3, CN II-XII normal, motor strength normal and equal, DTRs 2+ and symmetrical biceps, radial, and patellar tendons. Cerebellar -  resting tremor, no rigidity, fairly movement and normal gait. Derm - Head, neck, back, abdomen and extremities without suspicious lesions Psych - emotional, close to tears intermittently, depressed affect.  Recent Results (from the past 2160 hour(s))  HEMOGLOBIN A1C     Status: Abnormal   Collection Time    05/23/13 10:42 AM      Result Value Ref Range   Hemoglobin A1C 6.8 (*) 4.6 - 6.5 %   Comment: Glycemic Control Guidelines for People with Diabetes:Non Diabetic:  <6%Goal of Therapy: <7%Additional Action Suggested:  >8%   COMPREHENSIVE METABOLIC PANEL     Status: Abnormal   Collection Time    05/23/13 10:42 AM      Result Value Ref Range   Sodium 137  135 - 145 mEq/L   Potassium 3.8  3.5 - 5.1 mEq/L   Chloride 101  96 - 112 mEq/L   CO2 30  19 - 32 mEq/L   Glucose, Bld 173 (*) 70 - 99 mg/dL   BUN 14  6 - 23 mg/dL   Creatinine, Ser 1.3 (*) 0.4 - 1.2 mg/dL   Total Bilirubin 0.8  0.3 - 1.2 mg/dL   Alkaline Phosphatase 61  39 - 117 U/L   AST 19  0 - 37 U/L   ALT 3  0 - 35 U/L   Total Protein 7.1  6.0 - 8.3 g/dL   Albumin 4.2  3.5 - 5.2 g/dL   Calcium 21.3  8.4 - 08.6 mg/dL   GFR 57.84 (*) >69.62 mL/min  LIPID PANEL     Status: None   Collection Time    05/23/13 10:42 AM      Result Value Ref Range   Cholesterol 87  0 - 200 mg/dL   Comment: ATP III Classification       Desirable:  < 200 mg/dL               Borderline High:  200 - 239 mg/dL          High:  > = 952 mg/dL   Triglycerides  89.0  0.0 - 149.0 mg/dL   Comment: Normal:  <161<150 mg/dLBorderline High:  150 - 199 mg/dL   HDL 09.6043.50  >45.40>39.00 mg/dL   VLDL 98.117.8  0.0 - 19.140.0 mg/dL   LDL Cholesterol 26  0 - 99 mg/dL   Total CHOL/HDL Ratio 2     Comment:                Men          Women1/2 Average Risk     3.4          3.3Average Risk          5.0          4.42X Average Risk          9.6          7.13X Average Risk          15.0          11.0                      HEMOGLOBIN AND HEMATOCRIT, BLOOD     Status: None   Collection Time     05/23/13 10:42 AM      Result Value Ref Range   Hemoglobin 13.9  12.0 - 15.0 g/dL   HCT 47.842.6  29.536.0 - 62.146.0 %         Assessment & Plan:

## 2013-05-23 NOTE — Progress Notes (Signed)
Pre visit review using our clinic review tool, if applicable. No additional management support is needed unless otherwise documented below in the visit note. 

## 2013-05-23 NOTE — Patient Instructions (Addendum)
Thanks for coming in to see me.  Depression and grief - you seem to be suffering despite the use of medication.  Plan Referral to [facility] [phone] to set up an appointment for grief counseling and depression with one of our therapists.   Swallow difficulty - will order a barium swallow to look for blockage or stricutre in the esophagus that could be causing a problem with swallow.   We will give you the Prevnar pneumonia vaccine today, once and done.  Routine labs today and results will be sent via MyChart.  New doctor - please sign up with DR. Yetta BarreJones as your new doctor.

## 2013-05-24 NOTE — Assessment & Plan Note (Signed)
Patient appears to be depressed, and admits to being depressed and anxious, despite medication adherence.  Plan Continue present medications  Refer to [facility]

## 2013-05-24 NOTE — Assessment & Plan Note (Signed)
Follow [facility]. Tolerating medication. This diagnosis contributes to her depression. She is able to manage her ADLs, although constrained by her depression based lack of motivation.

## 2013-05-24 NOTE — Assessment & Plan Note (Signed)
Lab Results  Component Value Date   HGBA1C 6.8* 05/23/2013   Good control on present medication.   Plan Continue present regimen  Needs annual eye exam.

## 2013-05-24 NOTE — Assessment & Plan Note (Signed)
Taking and tolerating statin therapy. No adverse affects. Last lab with great control - LDL better than goal.  PLan Continue present regimen

## 2013-05-24 NOTE — Assessment & Plan Note (Signed)
Mrs. Barbara Bradshaw c/o dysphagia and has had to regurgitate solids in the past. No difficulty with liquids. Discussed need for diagnostic w/u for stricture vs presbyesophagus.  Plan To be schedule for barium swallow with recommendations to follow

## 2013-05-24 NOTE — Assessment & Plan Note (Signed)
Patient's BMI is down from 32 to 30.  Plan Continue to work on [employer] management

## 2013-05-24 NOTE — Assessment & Plan Note (Signed)
BP Readings from Last 3 Encounters:  05/23/13 110/66  05/04/13 128/70  04/13/13 140/80   Good control on present medication - normal Bmet.  Plan Continue present medication

## 2013-05-24 NOTE — Assessment & Plan Note (Signed)
Inteval hx w/o acute major issues requiring hospitalization but she has had significant personal loss and also is dealing with Parkinson's diagnosis. Limited physical exam is unremarkable. Labs reviewed - good control of diabetes and lipids. She is current with colorectal cancer screening and mammography. Immunizations are up to date and she is given Prevnar today.  In summary A nice woman struggling with her depression and Parkinson's disease. She is encouraged to call [facility]. She will return for general follow up DM, etc. In 6 months, sooner as needed.

## 2013-05-24 NOTE — Assessment & Plan Note (Signed)
As above undeer dyspepsia - concern for esophageal stricture vs motility disorder.  Plan For Barium swallow.

## 2013-05-25 ENCOUNTER — Ambulatory Visit (INDEPENDENT_AMBULATORY_CARE_PROVIDER_SITE_OTHER): Payer: Medicare Other | Admitting: Diagnostic Neuroimaging

## 2013-05-25 ENCOUNTER — Encounter: Payer: Self-pay | Admitting: Diagnostic Neuroimaging

## 2013-05-25 VITALS — BP 121/77 | HR 99 | Ht 61.0 in | Wt 169.0 lb

## 2013-05-25 DIAGNOSIS — R269 Unspecified abnormalities of gait and mobility: Secondary | ICD-10-CM

## 2013-05-25 DIAGNOSIS — F32A Depression, unspecified: Secondary | ICD-10-CM

## 2013-05-25 DIAGNOSIS — G20 Parkinson's disease: Secondary | ICD-10-CM

## 2013-05-25 DIAGNOSIS — F329 Major depressive disorder, single episode, unspecified: Secondary | ICD-10-CM

## 2013-05-25 DIAGNOSIS — F3289 Other specified depressive episodes: Secondary | ICD-10-CM

## 2013-05-25 NOTE — Progress Notes (Signed)
[facility]  PATIENT: Barbara Bradshaw DOB: 12/18/1945  REFERRING CLINICIAN:  HISTORY FROM: patient and husband REASON FOR VISIT: follow up   HISTORICAL  CHIEF COMPLAINT:  Chief Complaint  Patient presents with  . Follow-up    increased symptoms, Rm 7    HISTORY OF PRESENT ILLNESS:   UPDATE 05/25/13: Since last visit, multiple family tragedies: nephew and mother passed away. This has led to deep depression. Patient continues to struggle on daily basis, with fatigue, depression, crying. Other parkinson's dz symptoms are stable. Still taking carb/levo, but misses mid-day dose many times.  UPDATE 03/14/13: Since last visit, doing well. Some wearing off symptoms noted when she wakes up, but not later in the day. Tolerating carb/levo 25/100, 1/5 tabs TID. No falls. She has some swallowing issues (feels food caught in mid-lower esophagus). Some nerve/anxiety issues, but manageable.   UPDATE 08/03/12: since last visit no new changes. Patient's tremor is under good control with her carbidopa/levodopa. Sometimes she forgets to take the midday dose. She takes her medications regularly, it seems to work well. If she misses a dose, she notices wearing off. Patient also reports mild short-term memory loss. No anxiety. Some constipation. I noted some oral-lingual dyskinesias today, which patient does not notice. Husband has noticed this. She attributes this to her ill fitting dentures.  UPDATE 03/08/12: Since last visit, doing better. Increased carb/levo seems to work better. Some off-time in early AM, otherise no waearing off. No side effects. No anxiety, constipation or falls. some swallow diff, but mild.  UPDATE 01/01/12:  She feels like carbidopa-levodopa has helped 15%.  She is tolerating carbidopa-levodopa well without dizziness, she has had occasional nausea.  She takes her midday dose with her meals.  Denies any falls. No change in smell and taste, bowel or bladder.  PRIOR  HPI (Dr. Marjory Lies) 68 year old right-handed female with history of diabetes, hypertension, hyperlipidemia, here for evaluation of tremor for the past one year.  Patient noted intermittent tremor when she is doing certain actions such as handwriting, cooking, holding objects. Her husband has noted some intermittent resting tremor. Patient fell down in June 2012. She feels that her balance and walking have slightly worsened over time. Husband has noticed change in her voice. Patient having a little bit more anxiety, as well as very vivid dreams lately. Patient has family history Parkinson's disease in her sister and her father. No change in sense of smell or taste.   REVIEW OF SYSTEMS: Full 14 system review of systems performed and notable only for depression dizziness memory loss headache nervousness.   ALLERGIES: No Known Allergies  HOME MEDICATIONS: Outpatient Prescriptions Prior to Visit  Medication Sig Dispense Refill  . aspirin 81 MG tablet Take 81 mg by mouth daily.        Marland Kitchen atorvastatin (LIPITOR) 40 MG tablet Take 1 tablet (40 mg total) by mouth daily.  30 tablet  5  . b complex vitamins capsule Take 1 capsule by mouth daily.      . betamethasone dipropionate (DIPROLENE) 0.05 % cream Apply topically 2 (two) times daily.       Marland Kitchen buPROPion (WELLBUTRIN XL) 150 MG 24 hr tablet Take 1 tablet (150 mg total) by mouth daily.  30 tablet  5  . calcium carbonate (OS-CAL) 600 MG TABS Take 600 mg by mouth daily.        . carbidopa-levodopa (SINEMET IR) 25-100 MG per tablet Take 1.5 tablets by mouth 3 (three) times daily.  450 tablet  4  . cholecalciferol (VITAMIN D) 1000 UNITS tablet Take 1,000 Units by mouth daily.      . fish oil-omega-3 fatty acids 1000 MG capsule Take 1 g by mouth daily.      . furosemide (LASIX) 40 MG tablet Take 1 tablet (40 mg total) by mouth daily.  90 tablet  3  . glipiZIDE (GLUCOTROL XL) 10 MG 24 hr tablet Take 1 tablet (10 mg total) by mouth daily.  90 tablet  3  .  Lancets (ONETOUCH ULTRASOFT) lancets Use as instructed  100 each  12  . metFORMIN (GLUCOPHAGE) 1000 MG tablet TAKE 1 TABLET BY MOUTH TWICE DAILY  180 tablet  3  . Multiple Vitamin (MULTIVITAMIN) tablet Take 1 tablet by mouth daily.      . ONE TOUCH ULTRA TEST test strip       . quinapril (ACCUPRIL) 10 MG tablet Take 1 tablet (10 mg total) by mouth daily.  90 tablet  2  . simethicone (MYLICON) 125 MG chewable tablet Chew 125 mg by mouth every 6 (six) hours as needed.        . traZODone (DESYREL) 50 MG tablet Take 1 tablet (50 mg total) by mouth at bedtime.  30 tablet  5  . vitamin C (ASCORBIC ACID) 500 MG tablet Take 500 mg by mouth daily.      Marland Kitchen zinc gluconate 50 MG tablet Take 50 mg by mouth daily.       No facility-administered medications prior to visit.    PAST MEDICAL HISTORY: Past Medical History  Diagnosis Date  . Diabetes mellitus, type 2   . Hypertension   . Rosacea   . Nephrolithiasis   . Hyperlipidemia   . Parkinson's disease 12/2011  . Parkinson disease     PAST SURGICAL HISTORY: Past Surgical History  Procedure Laterality Date  . Tubal ligation    . Right arm fx with orif      FAMILY HISTORY: Family History  Problem Relation Age of Onset  . Dementia Mother   . Cancer Mother     breast  . Heart disease Mother   . Hypertension Mother   . Diabetes Father   . Colon cancer Neg Hx   . Stomach cancer Neg Hx     SOCIAL HISTORY:  History   Social History  . Marital Status: Married    Spouse Name: [name]    Number of Children: 3  . Years of Education: 14   Occupational History  . retired    Social History Main Topics  . Smoking status: Never Smoker   . Smokeless tobacco: Never Used  . Alcohol Use: No  . Drug Use: No  . Sexual Activity: Not Currently   Other Topics Concern  . Not on file   Social History Narrative   HSG, 2 years collegeMarried 1966. 3 sons- 1968, 40, 63. 3 grandchildren. Retired (disability)   Lives with SO in their own  home. ACP - no prolonged support on artificial means.      PHYSICAL EXAM  Filed Vitals:   05/25/13 1238  BP: 121/77  Pulse: 99  Height: 5\' 1"  (1.549 m)  Weight: 169 lb (76.658 kg)   Body mass index is 31.95 kg/(m^2).  EXAM: General: CRYING, TEARFUL, DEPRESSED.  Neck: Neck is supple. Cardiovascular: No carotid artery bruits.  Heart is regular rate and rhythm with no murmurs.  Neurologic Exam  Mental Status: Awake, alert. Language is fluent and comprehension intact.  NEG MYERSONS. NEG SNOUT.  Cranial Nerves: Pupils are equal and reactive to light.  Visual fields are full to confrontation.  Conjugate eye movements are full and symmetric; EXCEPT FOR RIGHT EXO/HYPERTROPIA. Facial sensation and strength are symmetric.  Hearing is intact.  Palate elevated symmetrically and uvula is midline.  Shoulder shrug is symmetric.  Tongue is midline.  Motor: Normal bulk. MILD ACTION/POSTURAL. NO REST TREMOR. BRADYKINESIA IN LUE>RUE.  COGWHEELING IN LUE. Full strength in the upper and lower extremities.  No pronator drift. Sensory: Intact and symmetric to light touch and temperature Coordination: No ataxia or dysmetria on finger-nose or rapid alternating movement testing. Gait and Station: STOOPED POSTURE. MODERATE ARM SWING.  Reflexes: Deep tendon reflexes in the upper and lower extremity are present and symmetric; TRACE AT ANKLES.   DIAGNOSTIC DATA (LABS, IMAGING, TESTING) - I reviewed patient records, labs, notes, testing and imaging myself where available.  Lab Results  Component Value Date   WBC 9.1 10/21/2011   HGB 13.9 05/23/2013   HCT 42.6 05/23/2013   MCV 87.4 10/21/2011   PLT 136.0* 10/21/2011      Component Value Date/Time   NA 137 05/23/2013 1042   K 3.8 05/23/2013 1042   CL 101 05/23/2013 1042   CO2 30 05/23/2013 1042   GLUCOSE 173* 05/23/2013 1042   BUN 14 05/23/2013 1042   CREATININE 1.3* 05/23/2013 1042   CALCIUM 10.0 05/23/2013 1042   PROT 7.1 05/23/2013 1042   ALBUMIN 4.2  05/23/2013 1042   AST 19 05/23/2013 1042   ALT 3 05/23/2013 1042   ALKPHOS 61 05/23/2013 1042   BILITOT 0.8 05/23/2013 1042   GFRNONAA 65.12 01/14/2010 1526   GFRAA 109 02/15/2008 1323   Lab Results  Component Value Date   CHOL 87 05/23/2013   HDL 43.50 05/23/2013   LDLCALC 26 05/23/2013   LDLDIRECT 40.1 03/09/2007   TRIG 89.0 05/23/2013   CHOLHDL 2 05/23/2013   Lab Results  Component Value Date   HGBA1C 6.8* 05/23/2013   Lab Results  Component Value Date   VITAMINB12 546 10/21/2011   Lab Results  Component Value Date   TSH 0.31* 10/21/2011    11/19/11 MRI of brain - multiple round and ovoid, periventricular, subcortical, thalamic and pontine foci of T2 hyperintensity likely representative of chronic small vessel ischemic disease.   ASSESSMENT AND PLAN  68 y.o. year-old female with progressive mixed tremor (action and postural greater than resting), bradykinesia, cogwheeling rigidity, postural instability. She has 4 of the 4 cardinal features for Parkinson's disease. Doing fair on carb/levo.   Now with significantly worsening depression related to recent family events.  Dx: idiopathic parkinson's disease + major depresssion   PLAN: 1. continue carbidopa-levodopa 1.5tabs TID 2. may consider azilect or dopamine agonist in future 3. home health PT/nursing evaluation 4. agree with psychiatry/psychology eval and mgmt of depression  Return in about 4 months (around 09/22/2013).     Suanne MarkerVIKRAM R. PENUMALLI, MD 05/25/2013, 1:41 PM Certified in Neurology, Neurophysiology and Neuroimaging  [facility] [address], [address] [place], [place] [address] [phone]

## 2013-05-27 ENCOUNTER — Inpatient Hospital Stay (HOSPITAL_COMMUNITY): Admission: RE | Admit: 2013-05-27 | Payer: Medicare Other | Source: Ambulatory Visit

## 2013-05-27 ENCOUNTER — Telehealth: Payer: Self-pay | Admitting: *Deleted

## 2013-05-27 NOTE — Telephone Encounter (Signed)
Patient phoned requesting recent lab results. Cannot access her mychart.  CB# [phone]

## 2013-05-28 NOTE — Telephone Encounter (Signed)
Labs looked fine. May print and mail her a copy of her labs. Thanks

## 2013-05-30 NOTE — Telephone Encounter (Signed)
Phoned & notified spouse to tell patient labs were being put in the mail to her.

## 2013-06-01 ENCOUNTER — Other Ambulatory Visit: Payer: Self-pay | Admitting: Internal Medicine

## 2013-06-01 ENCOUNTER — Ambulatory Visit (HOSPITAL_COMMUNITY)
Admission: RE | Admit: 2013-06-01 | Discharge: 2013-06-01 | Disposition: A | Discharge status: Home / Self Care | Payer: Medicare Other | Source: Ambulatory Visit | Attending: Internal Medicine | Admitting: Internal Medicine

## 2013-06-01 DIAGNOSIS — K449 Diaphragmatic hernia without obstruction or gangrene: Secondary | ICD-10-CM | POA: Insufficient documentation

## 2013-06-01 DIAGNOSIS — R1310 Dysphagia, unspecified: Secondary | ICD-10-CM | POA: Insufficient documentation

## 2013-06-01 DIAGNOSIS — R1319 Other dysphagia: Secondary | ICD-10-CM

## 2013-06-01 DIAGNOSIS — K224 Dyskinesia of esophagus: Secondary | ICD-10-CM | POA: Insufficient documentation

## 2013-07-07 ENCOUNTER — Other Ambulatory Visit: Payer: Self-pay

## 2013-08-03 ENCOUNTER — Other Ambulatory Visit: Payer: Self-pay | Admitting: *Deleted

## 2013-08-03 MED ORDER — GLIPIZIDE ER 10 MG PO TB24: 10.0000 mg | ORAL_TABLET | Freq: Every day | ORAL | Status: DC

## 2013-08-10 ENCOUNTER — Encounter: Payer: Self-pay | Admitting: Internal Medicine

## 2013-08-10 ENCOUNTER — Ambulatory Visit (INDEPENDENT_AMBULATORY_CARE_PROVIDER_SITE_OTHER): Payer: Medicare Other | Admitting: Internal Medicine

## 2013-08-10 ENCOUNTER — Other Ambulatory Visit (INDEPENDENT_AMBULATORY_CARE_PROVIDER_SITE_OTHER): Payer: Medicare Other

## 2013-08-10 VITALS — BP 132/70 | HR 81 | Temp 98.0°F | Resp 16 | Ht 61.0 in | Wt 163.5 lb

## 2013-08-10 DIAGNOSIS — E66811 Obesity, class 1: Secondary | ICD-10-CM

## 2013-08-10 DIAGNOSIS — Z23 Encounter for immunization: Secondary | ICD-10-CM

## 2013-08-10 DIAGNOSIS — E119 Type 2 diabetes mellitus without complications: Secondary | ICD-10-CM

## 2013-08-10 DIAGNOSIS — Z0000 Encounter for general adult medical examination without abnormal findings: Secondary | ICD-10-CM

## 2013-08-10 DIAGNOSIS — E785 Hyperlipidemia, unspecified: Secondary | ICD-10-CM

## 2013-08-10 DIAGNOSIS — I10 Essential (primary) hypertension: Secondary | ICD-10-CM

## 2013-08-10 DIAGNOSIS — E669 Obesity, unspecified: Secondary | ICD-10-CM

## 2013-08-10 DIAGNOSIS — M8580 Other specified disorders of bone density and structure, unspecified site: Secondary | ICD-10-CM | POA: Insufficient documentation

## 2013-08-10 DIAGNOSIS — M899 Disorder of bone, unspecified: Secondary | ICD-10-CM

## 2013-08-10 DIAGNOSIS — M949 Disorder of cartilage, unspecified: Secondary | ICD-10-CM

## 2013-08-10 LAB — BASIC METABOLIC PANEL
BUN: 11 mg/dL (ref 6–23)
CALCIUM: 10.1 mg/dL (ref 8.4–10.5)
CO2: 31 meq/L (ref 19–32)
CREATININE: 1.2 mg/dL (ref 0.4–1.2)
Chloride: 98 mEq/L (ref 96–112)
GFR: 47.86 mL/min — ABNORMAL LOW (ref >60.00)
GLUCOSE: 141 mg/dL — AB (ref 70–99)
Potassium: 4.8 mEq/L (ref 3.5–5.1)
Sodium: 138 mEq/L (ref 135–145)

## 2013-08-10 LAB — TSH: TSH: 0.58 u[IU]/mL (ref 0.35–4.50)

## 2013-08-10 LAB — HEMOGLOBIN A1C: Hgb A1c MFr Bld: 6.5 % (ref 4.6–6.5)

## 2013-08-10 NOTE — Patient Instructions (Signed)
Type 2 Diabetes Mellitus, Adult Type 2 diabetes mellitus, often simply referred to as type 2 diabetes, is a long-lasting (chronic) disease. In type 2 diabetes, the pancreas does not make enough insulin (a hormone), the cells are less responsive to the insulin that is made (insulin resistance), or both. Normally, insulin moves sugars from food into the tissue cells. The tissue cells use the sugars for energy. The lack of insulin or the lack of normal response to insulin causes excess sugars to build up in the blood instead of going into the tissue cells. As a result, high blood sugar (hyperglycemia) develops. The effect of high sugar (glucose) levels can cause many complications. Type 2 diabetes was also previously called adult-onset diabetes but it can occur at any age.  RISK FACTORS  A person is predisposed to developing type 2 diabetes if someone in the family has the disease and also has one or more of the following primary risk factors:  Overweight.  An inactive lifestyle.  A history of consistently eating high-calorie foods. Maintaining a normal weight and regular physical activity can reduce the chance of developing type 2 diabetes. SYMPTOMS  A person with type 2 diabetes may not show symptoms initially. The symptoms of type 2 diabetes appear slowly. The symptoms include:  Increased thirst (polydipsia).  Increased urination (polyuria).  Increased urination during the night (nocturia).  Weight loss. This weight loss may be rapid.  Frequent, recurring infections.  Tiredness (fatigue).  Weakness.  Vision changes, such as blurred vision.  Fruity smell to your breath.  Abdominal pain.  Nausea or vomiting.  Cuts or bruises which are slow to heal.  Tingling or numbness in the hands or feet. DIAGNOSIS Type 2 diabetes is frequently not diagnosed until complications of diabetes are present. Type 2 diabetes is diagnosed when symptoms or complications are present and when blood  glucose levels are increased. Your blood glucose level may be checked by one or more of the following blood tests:  A fasting blood glucose test. You will not be allowed to eat for at least 8 hours before a blood sample is taken.  A random blood glucose test. Your blood glucose is checked at any time of the day regardless of when you ate.  A hemoglobin A1c blood glucose test. A hemoglobin A1c test provides information about blood glucose control over the previous 3 months.  An oral glucose tolerance test (OGTT). Your blood glucose is measured after you have not eaten (fasted) for 2 hours and then after you drink a glucose-containing beverage. TREATMENT   You may need to take insulin or diabetes medicine daily to keep blood glucose levels in the desired range.  You will need to match insulin dosing with exercise and healthy food choices. The treatment goal is to maintain the before meal blood sugar (preprandial glucose) level at 70 130 mg/dL. HOME CARE INSTRUCTIONS   Have your hemoglobin A1c level checked twice a year.  Perform daily blood glucose monitoring as directed by your caregiver.  Monitor urine ketones when you are ill and as directed by your caregiver.  Take your diabetes medicine or insulin as directed by your caregiver to maintain your blood glucose levels in the desired range.  Never run out of diabetes medicine or insulin. It is needed every day.  Adjust insulin based on your intake of carbohydrates. Carbohydrates can raise blood glucose levels but need to be included in your diet. Carbohydrates provide vitamins, minerals, and fiber which are an essential part of   a healthy diet. Carbohydrates are found in fruits, vegetables, whole grains, dairy products, legumes, and foods containing added sugars.    Eat healthy foods. Alternate 3 meals with 3 snacks.  Lose weight if overweight.  Carry a medical alert card or wear your medical alert jewelry.  Carry a 15 gram  carbohydrate snack with you at all times to treat low blood glucose (hypoglycemia). Some examples of 15 gram carbohydrate snacks include:  Glucose tablets, 3 or 4   Glucose gel, 15 gram tube  Raisins, 2 tablespoons (24 grams)  Jelly beans, 6  Animal crackers, 8  Regular pop, 4 ounces (120 mL)  Gummy treats, 9  Recognize hypoglycemia. Hypoglycemia occurs with blood glucose levels of 70 mg/dL and below. The risk for hypoglycemia increases when fasting or skipping meals, during or after intense exercise, and during sleep. Hypoglycemia symptoms can include:  Tremors or shakes.  Decreased ability to concentrate.  Sweating.  Increased heart rate.  Headache.  Dry mouth.  Hunger.  Irritability.  Anxiety.  Restless sleep.  Altered speech or coordination.  Confusion.  Treat hypoglycemia promptly. If you are alert and able to safely swallow, follow the 15:15 rule:  Take 15 20 grams of rapid-acting glucose or carbohydrate. Rapid-acting options include glucose gel, glucose tablets, or 4 ounces (120 mL) of fruit juice, regular soda, or low fat milk.  Check your blood glucose level 15 minutes after taking the glucose.  Take 15 20 grams more of glucose if the repeat blood glucose level is still 70 mg/dL or below.  Eat a meal or snack within 1 hour once blood glucose levels return to normal.    Be alert to polyuria and polydipsia which are early signs of hyperglycemia. An early awareness of hyperglycemia allows for prompt treatment. Treat hyperglycemia as directed by your caregiver.  Engage in at least 150 minutes of moderate-intensity physical activity a week, spread over at least 3 days of the week or as directed by your caregiver. In addition, you should engage in resistance exercise at least 2 times a week or as directed by your caregiver.  Adjust your medicine and food intake as needed if you start a new exercise or sport.  Follow your sick day plan at any time you  are unable to eat or drink as usual.  Avoid tobacco use.  Limit alcohol intake to no more than 1 drink per day for nonpregnant women and 2 drinks per day for men. You should drink alcohol only when you are also eating food. Talk with your caregiver whether alcohol is safe for you. Tell your caregiver if you drink alcohol several times a week.  Follow up with your caregiver regularly.  Schedule an eye exam soon after the diagnosis of type 2 diabetes and then annually.  Perform daily skin and foot care. Examine your skin and feet daily for cuts, bruises, redness, nail problems, bleeding, blisters, or sores. A foot exam by a caregiver should be done annually.  Brush your teeth and gums at least twice a day and floss at least once a day. Follow up with your dentist regularly.  Share your diabetes management plan with your workplace or school.  Stay up-to-date with immunizations.  Learn to manage stress.  Obtain ongoing diabetes education and support as needed.  Participate in, or seek rehabilitation as needed to maintain or improve independence and quality of life. Request a physical or occupational therapy referral if you are having foot or hand numbness or difficulties with grooming,   dressing, eating, or physical activity. SEEK MEDICAL CARE IF:   You are unable to eat food or drink fluids for more than 6 hours.  You have nausea and vomiting for more than 6 hours.  Your blood glucose level is over 240 mg/dL.  There is a change in mental status.  You develop an additional serious illness.  You have diarrhea for more than 6 hours.  You have been sick or have had a fever for a couple of days and are not getting better.  You have pain during any physical activity.  SEEK IMMEDIATE MEDICAL CARE IF:  You have difficulty breathing.  You have moderate to large ketone levels. MAKE SURE YOU:  Understand these instructions.  Will watch your condition.  Will get help right away if  you are not doing well or get worse. Document Released: 03/17/2005 Document Revised: 12/10/2011 Document Reviewed: 10/14/2011 [employer] Patient Information 2014 [employer].  

## 2013-08-10 NOTE — Progress Notes (Signed)
Subjective:    Patient ID: Barbara Bradshaw, female    DOB: 06/09/1945, 68 y.o.   MRN: [id number]  Diabetes She presents for her follow-up diabetic visit. She has type 2 diabetes mellitus. Her disease course has been stable. Hypoglycemia symptoms include tremors. Pertinent negatives for hypoglycemia include no dizziness, headaches, seizures or speech difficulty. Pertinent negatives for diabetes include no blurred vision, no chest pain, no fatigue, no foot paresthesias, no foot ulcerations, no polydipsia, no polyphagia, no polyuria, no visual change, no weakness and no weight loss. There are no hypoglycemic complications. Symptoms are stable. Diabetic complications include nephropathy. Current diabetic treatment includes oral agent (monotherapy). She is compliant with treatment all of the time. Her weight is stable. She is following a diabetic diet. Meal planning includes avoidance of concentrated sweets. She has not had a previous visit with a dietician. She participates in exercise intermittently. There is no change in her home blood glucose trend. Her breakfast blood glucose range is generally 130-140 mg/dl. Her lunch blood glucose range is generally 140-180 mg/dl. Her dinner blood glucose range is generally 140-180 mg/dl. Her highest blood glucose is 140-180 mg/dl. Her overall blood glucose range is 140-180 mg/dl. An ACE inhibitor/angiotensin II receptor blocker is being taken. She does not see a podiatrist.Eye exam is current.      Review of Systems  Constitutional: Negative.  Negative for fever, chills, weight loss, diaphoresis, activity change, appetite change, fatigue and unexpected weight change.  HENT: Negative.   Eyes: Negative.  Negative for blurred vision.  Respiratory: Negative.  Negative for apnea, cough, choking, chest tightness, shortness of breath, wheezing and stridor.   Cardiovascular: Negative.  Negative for chest pain, palpitations and leg swelling.  Gastrointestinal: Negative.   Negative for nausea, vomiting, abdominal pain, diarrhea, constipation and blood in stool.  Endocrine: Negative.  Negative for polydipsia, polyphagia and polyuria.  Genitourinary: Negative.   Musculoskeletal: Negative.  Negative for arthralgias, back pain, myalgias and neck pain.  Skin: Negative.   Allergic/Immunologic: Negative.   Neurological: Positive for tremors. Negative for dizziness, seizures, syncope, facial asymmetry, speech difficulty, weakness, light-headedness, numbness and headaches.  Hematological: Negative.  Negative for adenopathy.  Psychiatric/Behavioral: Negative.        Objective:   Physical Exam  Vitals reviewed. Constitutional: She is oriented to person, place, and time. She appears well-developed and well-nourished. No distress.  HENT:  Head: Normocephalic and atraumatic.  Mouth/Throat: Oropharynx is clear and moist. No oropharyngeal exudate.  Eyes: Conjunctivae are normal. Right eye exhibits no discharge. Left eye exhibits no discharge. No scleral icterus.  Neck: Normal range of motion. Neck supple. No JVD present. No tracheal deviation present. No thyromegaly present.  Cardiovascular: Normal rate, regular rhythm, normal heart sounds and intact distal pulses.  Exam reveals no gallop and no friction rub.   No murmur heard. Pulmonary/Chest: Effort normal and breath sounds normal. No stridor. No respiratory distress. She has no wheezes. She has no rales. She exhibits no tenderness.  Abdominal: Soft. Bowel sounds are normal. She exhibits no distension and no mass. There is no tenderness. There is no rebound and no guarding.  Musculoskeletal: Normal range of motion. She exhibits no edema and no tenderness.  Lymphadenopathy:    She has no cervical adenopathy.  Neurological: She is alert and oriented to person, place, and time. She has normal strength. She displays tremor. She displays no atrophy. No cranial nerve deficit or sensory deficit. She exhibits normal muscle tone.  She displays a negative Romberg sign. She  displays no seizure activity. Coordination and gait normal.  Skin: Skin is warm and dry. No rash noted. She is not diaphoretic. No erythema. No pallor.  Psychiatric: She has a normal mood and affect. Her behavior is normal. Judgment and thought content normal.     Lab Results  Component Value Date   WBC 9.1 10/21/2011   HGB 13.9 05/23/2013   HCT 42.6 05/23/2013   PLT 136.0* 10/21/2011   GLUCOSE 173* 05/23/2013   CHOL 87 05/23/2013   TRIG 89.0 05/23/2013   HDL 43.50 05/23/2013   LDLDIRECT 40.1 03/09/2007   LDLCALC 26 05/23/2013   ALT 3 05/23/2013   AST 19 05/23/2013   NA 137 05/23/2013   K 3.8 05/23/2013   CL 101 05/23/2013   CREATININE 1.3* 05/23/2013   BUN 14 05/23/2013   CO2 30 05/23/2013   TSH 0.31* 10/21/2011   HGBA1C 6.8* 05/23/2013       Assessment & Plan:

## 2013-08-10 NOTE — Progress Notes (Signed)
Pre visit review using our clinic review tool, if applicable. No additional management support is needed unless otherwise documented below in the visit note. 

## 2013-08-11 NOTE — Assessment & Plan Note (Signed)
She is due for a DEXA scan 

## 2013-08-11 NOTE — Assessment & Plan Note (Signed)
She has achieved her LDL goal and is doing well on the statin 

## 2013-08-11 NOTE — Assessment & Plan Note (Signed)
Her blood sugars are well controlled 

## 2013-08-11 NOTE — Assessment & Plan Note (Signed)
Her blood pressure is well controlled. 

## 2013-08-26 ENCOUNTER — Other Ambulatory Visit: Payer: Self-pay | Admitting: Diagnostic Neuroimaging

## 2013-09-01 ENCOUNTER — Ambulatory Visit
Admission: RE | Admit: 2013-09-01 | Discharge: 2013-09-01 | Disposition: A | Discharge status: Home / Self Care | Payer: Medicare Other | Source: Ambulatory Visit | Attending: Internal Medicine | Admitting: Internal Medicine

## 2013-09-01 ENCOUNTER — Encounter: Payer: Self-pay | Admitting: Internal Medicine

## 2013-09-01 DIAGNOSIS — M8580 Other specified disorders of bone density and structure, unspecified site: Secondary | ICD-10-CM

## 2013-09-01 LAB — HM DEXA SCAN

## 2013-09-02 ENCOUNTER — Other Ambulatory Visit: Payer: Self-pay

## 2013-09-02 MED ORDER — FUROSEMIDE 40 MG PO TABS: 40.0000 mg | ORAL_TABLET | Freq: Every day | ORAL | Status: DC

## 2013-09-12 ENCOUNTER — Ambulatory Visit (INDEPENDENT_AMBULATORY_CARE_PROVIDER_SITE_OTHER): Payer: Medicare Other | Admitting: Nurse Practitioner

## 2013-09-12 ENCOUNTER — Encounter: Payer: Self-pay | Admitting: Nurse Practitioner

## 2013-09-12 VITALS — BP 124/69 | HR 85 | Temp 98.0°F | Ht 61.0 in | Wt 141.0 lb

## 2013-09-12 DIAGNOSIS — F32A Depression, unspecified: Secondary | ICD-10-CM

## 2013-09-12 DIAGNOSIS — R269 Unspecified abnormalities of gait and mobility: Secondary | ICD-10-CM

## 2013-09-12 DIAGNOSIS — G20A1 Parkinson's disease without dyskinesia, without mention of fluctuations: Secondary | ICD-10-CM

## 2013-09-12 DIAGNOSIS — G20 Parkinson's disease: Secondary | ICD-10-CM

## 2013-09-12 DIAGNOSIS — F329 Major depressive disorder, single episode, unspecified: Secondary | ICD-10-CM

## 2013-09-12 DIAGNOSIS — F3289 Other specified depressive episodes: Secondary | ICD-10-CM

## 2013-09-12 NOTE — Progress Notes (Signed)
PATIENT: Barbara REINERTSEN DOB: Apr 03, 1945  REASON FOR VISIT: routine follow up for PD HISTORY FROM: patient, husband  HISTORY OF PRESENT ILLNESS: UPDATE 09/12/13 (LL): Since last visit, patient has started back into therapy and her depression is better under control.  Parkinson's disease symptoms are stable.  She has chronic constipation, only 1 BM/week. HH that was ordered at last visit did not occur, they never received any call. Carb/Levo is at same dose, misses mid-day dose occasionally. She has more back pain lately, she feels it is from compression fx that occurred in 2012.  UPDATE 05/25/13: Since last visit, multiple family tragedies: nephew and mother passed away. This has led to deep depression. Patient continues to struggle on daily basis, with fatigue, depression, crying. Other parkinson's dz symptoms are stable. Still taking carb/levo, but misses mid-day dose many times.  UPDATE 03/14/13: Since last visit, doing well. Some wearing off symptoms noted when she wakes up, but not later in the day. Tolerating carb/levo 25/100, 1/5 tabs TID. No falls. She has some swallowing issues (feels food caught in mid-lower esophagus). Some nerve/anxiety issues, but manageable.  UPDATE 08/03/12: since last visit no new changes. Patient's tremor is under good control with her carbidopa/levodopa. Sometimes she forgets to take the midday dose. She takes her medications regularly, it seems to work well. If she misses a dose, she notices wearing off. Patient also reports mild short-term memory loss. No anxiety. Some constipation. I noted some oral-lingual dyskinesias today, which patient does not notice. Husband has noticed this. She attributes this to her ill fitting dentures.  UPDATE 03/08/12: Since last visit, doing better. Increased carb/levo seems to work better. Some off-time in early AM, otherise no waearing off. No side effects. No anxiety, constipation or falls. some swallow diff, but mild.  UPDATE  01/01/12: She feels like carbidopa-levodopa has helped 15%. She is tolerating carbidopa-levodopa well without dizziness, she has had occasional nausea. She takes her midday dose with her meals. Denies any falls. No change in smell and taste, bowel or bladder.  PRIOR HPI (Dr. Marjory Lies) 68 year old right-handed female with history of diabetes, hypertension, hyperlipidemia, here for evaluation of tremor for the past one year.  Patient noted intermittent tremor when she is doing certain actions such as handwriting, cooking, holding objects. Her husband has noted some intermittent resting tremor. Patient fell down in June 2012. She feels that her balance and walking have slightly worsened over time. Husband has noticed change in her voice. Patient having a little bit more anxiety, as well as very vivid dreams lately. Patient has family history Parkinson's disease in her sister and her father. No change in sense of smell or taste.   REVIEW OF SYSTEMS: Full 14 system review of systems performed and notable only for depression dizziness memory loss, constipation, back pain, walking difficulty, unexpected weight change, bruise easily, nervousness.  ALLERGIES: No Known Allergies  HOME MEDICATIONS: Outpatient Prescriptions Prior to Visit  Medication Sig Dispense Refill  . aspirin 81 MG tablet Take 81 mg by mouth daily.        Marland Kitchen atorvastatin (LIPITOR) 40 MG tablet Take 1 tablet (40 mg total) by mouth daily.  30 tablet  5  . b complex vitamins capsule Take 1 capsule by mouth daily.      . betamethasone dipropionate (DIPROLENE) 0.05 % cream Apply topically 2 (two) times daily.       Marland Kitchen buPROPion (WELLBUTRIN XL) 150 MG 24 hr tablet Take 1 tablet (150 mg total) by mouth  daily.  30 tablet  5  . calcium carbonate (OS-CAL) 600 MG TABS Take 600 mg by mouth daily.        . carbidopa-levodopa (SINEMET IR) 25-100 MG per tablet TAKE 1 1/2 TABLETS BY MOUTH THREE TIMES DAILY  180 tablet  6  . cholecalciferol (VITAMIN D)  1000 UNITS tablet Take 1,000 Units by mouth daily.      . fish oil-omega-3 fatty acids 1000 MG capsule Take 1 g by mouth daily.      . furosemide (LASIX) 40 MG tablet Take 1 tablet (40 mg total) by mouth daily.  90 tablet  3  . glipiZIDE (GLUCOTROL XL) 10 MG 24 hr tablet Take 1 tablet (10 mg total) by mouth daily.  90 tablet  0  . Lancets (ONETOUCH ULTRASOFT) lancets Use as instructed  100 each  12  . metFORMIN (GLUCOPHAGE) 1000 MG tablet TAKE 1 TABLET BY MOUTH TWICE DAILY  180 tablet  3  . Multiple Vitamin (MULTIVITAMIN) tablet Take 1 tablet by mouth daily.      . ONE TOUCH ULTRA TEST test strip       . quinapril (ACCUPRIL) 10 MG tablet Take 1 tablet (10 mg total) by mouth daily.  90 tablet  2  . simethicone (MYLICON) 125 MG chewable tablet Chew 125 mg by mouth every 6 (six) hours as needed.        . traZODone (DESYREL) 50 MG tablet Take 1 tablet (50 mg total) by mouth at bedtime.  30 tablet  5  . vitamin C (ASCORBIC ACID) 500 MG tablet Take 500 mg by mouth daily.      Marland Kitchen. zinc gluconate 50 MG tablet Take 50 mg by mouth daily.      . carbidopa-levodopa (SINEMET IR) 25-100 MG per tablet Take 1.5 tablets by mouth 3 (three) times daily.  450 tablet  4   No facility-administered medications prior to visit.     PHYSICAL EXAM  Filed Vitals:   09/12/13 1052  BP: 124/69  Pulse: 85  Temp: 98 F (36.7 C)  TempSrc: Oral  Height: 5\' 1"  (1.549 m)  Weight: 141 lb (63.957 kg)   Body mass index is 26.66 kg/(m^2). No exam data present  Neurologic Exam  Mental Status: Awake, alert. Language is fluent and comprehension intact. NEG MYERSONS. NEG SNOUT.  Cranial Nerves: Pupils are equal and reactive to light. Visual fields are full to confrontation. Conjugate eye movements are full and symmetric; EXCEPT FOR RIGHT EXO/HYPERTROPIA. Facial sensation and strength are symmetric. Hearing is intact. Palate elevated symmetrically and uvula is midline. Shoulder shrug is symmetric. Tongue is midline.  Motor:  Normal bulk. MILD ACTION/POSTURAL. NO REST TREMOR. BRADYKINESIA IN LUE>RUE. COGWHEELING IN LUE. Full strength in the upper and lower extremities. No pronator drift.  Sensory: Intact and symmetric to light touch and temperature  Coordination: No ataxia or dysmetria on finger-nose or rapid alternating movement testing.  Gait and Station: STOOPED POSTURE. MODERATE ARM SWING.  Reflexes: Deep tendon reflexes in the upper and lower extremity are present and symmetric; TRACE AT ANKLES.  11/19/11 MRI of brain - multiple round and ovoid, periventricular, subcortical, thalamic and pontine foci of T2 hyperintensity likely representative of chronic small vessel ischemic disease.   DIAGNOSTIC DATA (LABS, IMAGING, TESTING) - I reviewed patient records, labs, notes, testing and imaging myself where available.  Lab Results  Component Value Date   HGBA1C 6.5 08/10/2013   Lab Results  Component Value Date   VITAMINB12 546 10/21/2011  Lab Results  Component Value Date   TSH 0.58 08/10/2013    ASSESSMENT AND PLAN 68 y.o. year-old female with progressive mixed tremor (action and postural greater than resting), bradykinesia, cogwheeling rigidity, postural instability. She has 4 of the 4 cardinal features for Parkinson's disease. Stable on carb/levo. In therapy for depression related to recent family events, doing better.   Dx: idiopathic parkinson's disease + major depresssion   PLAN:  1. continue carbidopa-levodopa 1.5tabs TID  2. may consider azilect or dopamine agonist in future  3. Re-order HH PT for gait and balance training, assess home for safety.  Recommended regular exercise program- A.C.T. by Franki Monteeese, info given. 4. Continue with psychiatry/psychology mgmt of depression  5. Follow up in 6 months, sooner as needed.  Ronal FearLYNN E. LAM, MSN, NP-C 09/12/2013, 1:16 PM [facility] [address], [address] [place], [place] [address] [phone]  Note: This document was prepared with  digital dictation and possible smart phrase technology. Any transcriptional errors that result from this process are unintentional.

## 2013-09-12 NOTE — Progress Notes (Signed)
I reviewed note and agree with plan.   Suanne MarkerVIKRAM R. PENUMALLI, MD 09/12/2013, 4:21 PM Certified in Neurology, Neurophysiology and Neuroimaging  [facility] [address], [address] [place], [place] [address] [phone]

## 2013-09-12 NOTE — Patient Instructions (Addendum)
Continue carbidopa-levodopa 1.5tabs three times daily. Home health PT/nursing evaluation, I will reorder this and someone should call you within the next 2 weeks. I also recommend an exercise program called ACT by Franki Monteeese, a therapist that treats Parkinson's patients.  I have given you a brochure.  Please follow up with Dr. [name] in 6 months, sooner as needed.

## 2013-09-15 ENCOUNTER — Telehealth: Payer: Self-pay | Admitting: Diagnostic Neuroimaging

## 2013-09-15 ENCOUNTER — Telehealth: Payer: Self-pay | Admitting: Internal Medicine

## 2013-09-15 MED ORDER — QUINAPRIL HCL 10 MG PO TABS: 10.0000 mg | ORAL_TABLET | Freq: Every day | ORAL | Status: DC

## 2013-09-15 NOTE — Telephone Encounter (Signed)
Tiffany with [facility] called the patient's insurance is out of network for the [facility], so the referral was sent over to [facility] by Elmarie Shileyiffany.

## 2013-09-15 NOTE — Telephone Encounter (Signed)
Pt's information was given to Coolidge Breezeana Smith, CMA.

## 2013-09-15 NOTE — Telephone Encounter (Signed)
Patient left message stating that she needs new prescription for Quinapril.  [facility].

## 2013-09-15 NOTE — Telephone Encounter (Signed)
Barbara Rosieriffany Bradshaw from [facility] calling to speak with Barbara Bradshaw, says she has a question regarding the referral that was sent.

## 2013-09-27 ENCOUNTER — Encounter: Payer: Self-pay | Admitting: Internal Medicine

## 2013-09-28 ENCOUNTER — Telehealth: Payer: Self-pay

## 2013-09-28 DIAGNOSIS — F330 Major depressive disorder, recurrent, mild: Secondary | ICD-10-CM

## 2013-09-28 MED ORDER — BUPROPION HCL ER (XL) 150 MG PO TB24: 150.0000 mg | ORAL_TABLET | Freq: Every day | ORAL | Status: DC

## 2013-09-28 MED ORDER — TRAZODONE HCL 50 MG PO TABS: 50.0000 mg | ORAL_TABLET | Freq: Every day | ORAL | Status: DC

## 2013-09-28 NOTE — Telephone Encounter (Signed)
Received fax request for trazodone and bupropion, last filled 04/13/13 and patient last seen 08/10/13. Please advise

## 2013-10-29 ENCOUNTER — Other Ambulatory Visit: Payer: Self-pay | Admitting: Internal Medicine

## 2013-11-04 ENCOUNTER — Telehealth: Payer: Self-pay | Admitting: *Deleted

## 2013-11-04 NOTE — Telephone Encounter (Signed)
Left smg on triage requesting call abck. Called pt back she stated she received a letter in mail from [employer] [facility]. She was concern about medicare not paying for some of the test they had listed. It stated medicare would not pay for screening. Inform pt if md order any of those test they will put in a diagnoses code that would be approved by medicare. Pt has appt with Dr. Yetta BarreJones in Nov inform her when she come just bring the letter to show md & he can further explain if need too...Raechel Chute/lmb

## 2013-11-07 ENCOUNTER — Other Ambulatory Visit: Payer: Self-pay

## 2013-11-07 MED ORDER — ATORVASTATIN CALCIUM 40 MG PO TABS: 40.0000 mg | ORAL_TABLET | Freq: Every day | ORAL | Status: DC

## 2014-01-13 ENCOUNTER — Other Ambulatory Visit: Payer: Self-pay

## 2014-01-30 ENCOUNTER — Encounter: Payer: Self-pay | Admitting: Nurse Practitioner

## 2014-02-10 ENCOUNTER — Other Ambulatory Visit (INDEPENDENT_AMBULATORY_CARE_PROVIDER_SITE_OTHER): Payer: Medicare Other

## 2014-02-10 ENCOUNTER — Encounter: Payer: Self-pay | Admitting: Internal Medicine

## 2014-02-10 ENCOUNTER — Ambulatory Visit (INDEPENDENT_AMBULATORY_CARE_PROVIDER_SITE_OTHER): Payer: Medicare Other | Admitting: Internal Medicine

## 2014-02-10 VITALS — BP 118/68 | HR 80 | Temp 98.0°F | Resp 16 | Ht 61.0 in | Wt 151.0 lb

## 2014-02-10 DIAGNOSIS — E118 Type 2 diabetes mellitus with unspecified complications: Secondary | ICD-10-CM

## 2014-02-10 DIAGNOSIS — I10 Essential (primary) hypertension: Secondary | ICD-10-CM

## 2014-02-10 DIAGNOSIS — J309 Allergic rhinitis, unspecified: Secondary | ICD-10-CM | POA: Insufficient documentation

## 2014-02-10 DIAGNOSIS — E785 Hyperlipidemia, unspecified: Secondary | ICD-10-CM

## 2014-02-10 DIAGNOSIS — J302 Other seasonal allergic rhinitis: Secondary | ICD-10-CM

## 2014-02-10 LAB — CBC WITH DIFFERENTIAL/PLATELET
BASOS PCT: 0.4 % (ref 0.0–3.0)
Basophils Absolute: 0 10*3/uL (ref 0.0–0.1)
EOS ABS: 0.3 10*3/uL (ref 0.0–0.7)
EOS PCT: 5.3 % — AB (ref 0.0–5.0)
HCT: 39.6 % (ref 36.0–46.0)
HEMOGLOBIN: 13.1 g/dL (ref 12.0–15.0)
LYMPHS PCT: 33.4 % (ref 12.0–46.0)
Lymphs Abs: 2.1 10*3/uL (ref 0.7–4.0)
MCHC: 33 g/dL (ref 30.0–36.0)
MCV: 88.3 fl (ref 78.0–100.0)
Monocytes Absolute: 0.6 10*3/uL (ref 0.1–1.0)
Monocytes Relative: 9.8 % (ref 3.0–12.0)
NEUTROS ABS: 3.3 10*3/uL (ref 1.4–7.7)
Neutrophils Relative %: 51.1 % (ref 43.0–77.0)
Platelets: 179 10*3/uL (ref 150.0–400.0)
RBC: 4.48 Mil/uL (ref 3.87–5.11)
RDW: 12.8 % (ref 11.5–15.5)
WBC: 6.4 10*3/uL (ref 4.0–10.5)

## 2014-02-10 LAB — LIPID PANEL
Cholesterol: 94 mg/dL (ref 0–200)
HDL: 44.3 mg/dL (ref >39.00)
LDL Cholesterol: 39 mg/dL (ref 0–99)
NonHDL: 49.7
Total CHOL/HDL Ratio: 2
Triglycerides: 52 mg/dL (ref 0.0–149.0)
VLDL: 10.4 mg/dL (ref 0.0–40.0)

## 2014-02-10 LAB — URINALYSIS, ROUTINE W REFLEX MICROSCOPIC
BILIRUBIN URINE: NEGATIVE
Hgb urine dipstick: NEGATIVE
Nitrite: NEGATIVE
Specific Gravity, Urine: >=1.03 — AB (ref 1.000–1.030)
TOTAL PROTEIN, URINE-UPE24: NEGATIVE
UROBILINOGEN UA: 0.2 (ref 0.0–1.0)
Urine Glucose: NEGATIVE
pH: 5.5 (ref 5.0–8.0)

## 2014-02-10 LAB — BASIC METABOLIC PANEL
BUN: 17 mg/dL (ref 6–23)
CALCIUM: 10 mg/dL (ref 8.4–10.5)
CHLORIDE: 103 meq/L (ref 96–112)
CO2: 26 mEq/L (ref 19–32)
Creatinine, Ser: 1.3 mg/dL — ABNORMAL HIGH (ref 0.4–1.2)
GFR: 44.34 mL/min — ABNORMAL LOW (ref >60.00)
Glucose, Bld: 156 mg/dL — ABNORMAL HIGH (ref 70–99)
Potassium: 5.2 mEq/L — ABNORMAL HIGH (ref 3.5–5.1)
SODIUM: 139 meq/L (ref 135–145)

## 2014-02-10 LAB — MICROALBUMIN / CREATININE URINE RATIO
CREATININE, U: 283.6 mg/dL
MICROALB UR: 3.9 mg/dL — AB (ref 0.0–1.9)
MICROALB/CREAT RATIO: 1.4 mg/g (ref 0.0–30.0)

## 2014-02-10 LAB — TSH: TSH: 0.7 u[IU]/mL (ref 0.35–4.50)

## 2014-02-10 LAB — HEMOGLOBIN A1C: HEMOGLOBIN A1C: 6.1 % (ref 4.6–6.5)

## 2014-02-10 MED ORDER — FLUTICASONE PROPIONATE 50 MCG/ACT NA SUSP: 2.0000 | Freq: Every day | NASAL | Status: DC

## 2014-02-10 NOTE — Progress Notes (Signed)
Subjective:    Patient ID: Barbara Bradshaw, female    DOB: Aug 30, 1945, 68 y.o.   MRN: [id number]  Diabetes She has type 2 diabetes mellitus. Her disease course has been improving. Hypoglycemia symptoms include sweats. Pertinent negatives for hypoglycemia include no confusion, dizziness, headaches, hunger, mood changes, nervousness/anxiousness, pallor, seizures, sleepiness, speech difficulty or tremors. Pertinent negatives for diabetes include no blurred vision, no chest pain, no fatigue, no foot paresthesias, no foot ulcerations, no polydipsia, no polyphagia, no polyuria, no visual change, no weakness and no weight loss. Diabetic complications include PVD. Current diabetic treatment includes oral agent (dual therapy). She is compliant with treatment most of the time. Her weight is stable. She is following a generally healthy diet. Meal planning includes avoidance of concentrated sweets. She participates in exercise intermittently. There is no change in her home blood glucose trend. An ACE inhibitor/angiotensin II receptor blocker is being taken. She does not see a podiatrist.Eye exam is current.      Review of Systems  Constitutional: Negative.  Negative for fever, chills, weight loss, diaphoresis, activity change, appetite change, fatigue and unexpected weight change.  HENT: Positive for congestion, postnasal drip, rhinorrhea and sneezing. Negative for mouth sores, nosebleeds, sinus pressure, sore throat, tinnitus, trouble swallowing and voice change.   Eyes: Negative.  Negative for blurred vision.  Respiratory: Negative.  Negative for cough, choking, chest tightness, shortness of breath and stridor.   Cardiovascular: Negative.  Negative for chest pain, palpitations and leg swelling.  Gastrointestinal: Negative.  Negative for abdominal pain.  Endocrine: Negative.  Negative for polydipsia, polyphagia and polyuria.  Genitourinary: Negative.   Musculoskeletal: Negative.  Negative for back pain and  arthralgias.  Skin: Negative.  Negative for pallor.  Allergic/Immunologic: Negative.   Neurological: Negative for dizziness, tremors, seizures, speech difficulty, weakness and headaches.  Hematological: Negative.  Negative for adenopathy. Does not bruise/bleed easily.  Psychiatric/Behavioral: Negative.  Negative for confusion. The patient is not nervous/anxious.        Objective:   Physical Exam  Constitutional: She is oriented to person, place, and time. She appears well-developed and well-nourished. No distress.  HENT:  Head: Normocephalic and atraumatic.  Right Ear: Hearing, tympanic membrane, external ear and ear canal normal.  Left Ear: Hearing, tympanic membrane, external ear and ear canal normal.  Nose: Mucosal edema and rhinorrhea present. No nose lacerations, sinus tenderness, nasal deformity, septal deviation or nasal septal hematoma. No epistaxis.  No foreign bodies. Right sinus exhibits no maxillary sinus tenderness and no frontal sinus tenderness. Left sinus exhibits no maxillary sinus tenderness and no frontal sinus tenderness.  Mouth/Throat: Oropharynx is clear and moist and mucous membranes are normal. Mucous membranes are not pale, not dry and not cyanotic. No oral lesions. No trismus in the jaw. No uvula swelling. No oropharyngeal exudate, posterior oropharyngeal edema, posterior oropharyngeal erythema or tonsillar abscesses.  Eyes: Conjunctivae are normal. Right eye exhibits no discharge. Left eye exhibits no discharge. No scleral icterus.  Neck: Normal range of motion. Neck supple. No JVD present. No tracheal deviation present. No thyromegaly present.  Cardiovascular: Normal rate, regular rhythm, normal heart sounds and intact distal pulses.  Exam reveals no gallop and no friction rub.   No murmur heard. Pulmonary/Chest: Effort normal and breath sounds normal. No stridor. No respiratory distress. She has no wheezes. She has no rales. She exhibits no tenderness.  Abdominal:  Soft. Bowel sounds are normal. She exhibits no distension and no mass. There is no tenderness. There is no rebound  and no guarding.  Musculoskeletal: Normal range of motion. She exhibits no edema or tenderness.  Lymphadenopathy:    She has no cervical adenopathy.  Neurological: She is oriented to person, place, and time.  Skin: Skin is warm and dry. No rash noted. She is not diaphoretic. No erythema. No pallor.  Psychiatric: She has a normal mood and affect. Her behavior is normal. Judgment and thought content normal.  Vitals reviewed.     Lab Results  Component Value Date   WBC 9.1 10/21/2011   HGB 13.9 05/23/2013   HCT 42.6 05/23/2013   PLT 136.0* 10/21/2011   GLUCOSE 141* 08/10/2013   CHOL 87 05/23/2013   TRIG 89.0 05/23/2013   HDL 43.50 05/23/2013   LDLDIRECT 40.1 03/09/2007   LDLCALC 26 05/23/2013   ALT 3 05/23/2013   AST 19 05/23/2013   NA 138 08/10/2013   K 4.8 08/10/2013   CL 98 08/10/2013   CREATININE 1.2 08/10/2013   BUN 11 08/10/2013   CO2 31 08/10/2013   TSH 0.58 08/10/2013   HGBA1C 6.5 08/10/2013      Assessment & Plan:

## 2014-02-10 NOTE — Assessment & Plan Note (Signed)
She complains of some symptomatic low blood sugars so I have asked her to stop the SU I will recheck her A1C today and will monitor her renal function

## 2014-02-10 NOTE — Assessment & Plan Note (Signed)
She is due for a repeat FLP today

## 2014-02-10 NOTE — Patient Instructions (Signed)

## 2014-02-10 NOTE — Assessment & Plan Note (Signed)
Her BP is well controlled I will recheck her lytes and renal function today 

## 2014-02-10 NOTE — Progress Notes (Signed)
Pre visit review using our clinic review tool, if applicable. No additional management support is needed unless otherwise documented below in the visit note. 

## 2014-02-10 NOTE — Assessment & Plan Note (Signed)
She will start flonase ns for this

## 2014-03-14 ENCOUNTER — Other Ambulatory Visit: Payer: Self-pay | Admitting: *Deleted

## 2014-03-14 ENCOUNTER — Ambulatory Visit (INDEPENDENT_AMBULATORY_CARE_PROVIDER_SITE_OTHER): Payer: Medicare Other | Admitting: Diagnostic Neuroimaging

## 2014-03-14 ENCOUNTER — Encounter: Payer: Self-pay | Admitting: Diagnostic Neuroimaging

## 2014-03-14 VITALS — BP 124/73 | HR 83 | Ht 63.0 in | Wt 154.4 lb

## 2014-03-14 DIAGNOSIS — G20 Parkinson's disease: Secondary | ICD-10-CM

## 2014-03-14 DIAGNOSIS — F330 Major depressive disorder, recurrent, mild: Secondary | ICD-10-CM

## 2014-03-14 MED ORDER — CARBIDOPA-LEVODOPA 25-100 MG PO TABS: 2.0000 | ORAL_TABLET | Freq: Three times a day (TID) | ORAL | Status: DC

## 2014-03-14 MED ORDER — TRAZODONE HCL 50 MG PO TABS: 50.0000 mg | ORAL_TABLET | Freq: Every day | ORAL | Status: DC

## 2014-03-14 NOTE — Telephone Encounter (Signed)
Left m sg on triage need new rx for [facility]one [facility]. Called pt back LMOM rx has [facility]...Raechel Chute/lmb

## 2014-03-14 NOTE — Patient Instructions (Signed)
Increase carbidopa/levodopa up to 2 tabs three times per day as tolerated.

## 2014-03-14 NOTE — Progress Notes (Signed)
PATIENT: Barbara Bradshaw DOB: 1946-02-03  REASON FOR VISIT: routine follow up for PD HISTORY FROM: patient, husband  Chief Complaint  Patient presents with  . Follow-up    PD    HISTORY OF PRESENT ILLNESS:  UPDATE 03/14/14: Since last visit, right hand shaking more. Still struggles with back pain. No falls. Some balance issues. On carb/levo 25/100 (1.5 tabs BID; sometimes takes extra meds in mid day if has more tremor). More slowness/stiffness in early AM. Also notes more intermittent cold intolerance randomly through the day.  UPDATE 09/12/13 (LL): Since last visit, patient has started back into therapy and her depression is better under control.  Parkinson's disease symptoms are stable.  She has chronic constipation, only 1 BM/week. HH that was ordered at last visit did not occur, they never received any call. Carb/Levo is at same dose, misses mid-day dose occasionally. She has more back pain lately, she feels it is from compression fx that occurred in 2012.  UPDATE 05/25/13: Since last visit, multiple family tragedies: nephew and mother passed away. This has led to deep depression. Patient continues to struggle on daily basis, with fatigue, depression, crying. Other parkinson's dz symptoms are stable. Still taking carb/levo, but misses mid-day dose many times.   UPDATE 03/14/13: Since last visit, doing well. Some wearing off symptoms noted when she wakes up, but not later in the day. Tolerating carb/levo 25/100, 1/5 tabs TID. No falls. She has some swallowing issues (feels food caught in mid-lower esophagus). Some nerve/anxiety issues, but manageable.   UPDATE 08/03/12: since last visit no new changes. Patient's tremor is under good control with her carbidopa/levodopa. Sometimes she forgets to take the midday dose. She takes her medications regularly, it seems to work well. If she misses a dose, she notices wearing off. Patient also reports mild short-term memory loss. No anxiety. Some  constipation. I noted some oral-lingual dyskinesias today, which patient does not notice. Husband has noticed this. She attributes this to her ill fitting dentures.   UPDATE 03/08/12: Since last visit, doing better. Increased carb/levo seems to work better. Some off-time in early AM, otherise no waearing off. No side effects. No anxiety, constipation or falls. some swallow diff, but mild.   UPDATE 01/01/12: She feels like carbidopa-levodopa has helped 15%. She is tolerating carbidopa-levodopa well without dizziness, she has had occasional nausea. She takes her midday dose with her meals. Denies any falls. No change in smell and taste, bowel or bladder.   PRIOR HPI (Dr. [name]) 68 year old right-handed female with history of diabetes, hypertension, hyperlipidemia, here for evaluation of tremor for the past one year. Patient noted intermittent tremor when she is doing certain actions such as handwriting, cooking, holding objects. Her husband has noted some intermittent resting tremor. Patient fell down in June 2012. She feels that her balance and walking have slightly worsened over time. Husband has noticed change in her voice. Patient having a little bit more anxiety, as well as very vivid dreams lately. Patient has family history Parkinson's disease in her sister and her father. No change in sense of smell or taste.   REVIEW OF SYSTEMS: Full 14 system review of systems performed and notable only for depression chills constipation.   ALLERGIES: No Known Allergies  HOME MEDICATIONS: Outpatient Prescriptions Prior to Visit  Medication Sig Dispense Refill  . ADACEL 07-30-13.5 LF-MCG/0.5 injection     . aspirin 81 MG tablet Take 81 mg by mouth daily.      Marland Kitchen. atorvastatin (LIPITOR) 40  MG tablet Take 1 tablet (40 mg total) by mouth daily. 30 tablet 5  . b complex vitamins capsule Take 1 capsule by mouth daily.    Marland Kitchen. buPROPion (WELLBUTRIN XL) 150 MG 24 hr tablet Take 1 tablet (150 mg total) by mouth daily.  30 tablet 5  . calcium carbonate (OS-CAL) 600 MG TABS Take 600 mg by mouth daily.      . cholecalciferol (VITAMIN D) 1000 UNITS tablet Take 1,000 Units by mouth daily.    . fish oil-omega-3 fatty acids 1000 MG capsule Take 1 g by mouth daily.    . fluticasone (FLONASE) 50 MCG/ACT nasal spray Place 2 sprays into both nostrils daily. 16 g 11  . furosemide (LASIX) 40 MG tablet Take 1 tablet (40 mg total) by mouth daily. 90 tablet 3  . Lancets (ONETOUCH ULTRASOFT) lancets Use as instructed 100 each 12  . metFORMIN (GLUCOPHAGE) 1000 MG tablet TAKE 1 TABLET BY MOUTH TWICE DAILY (Patient taking differently: TAKE 1 TABLET BY MOUTH Once  DAILY) 180 tablet 3  . Multiple Vitamin (MULTIVITAMIN) tablet Take 1 tablet by mouth daily.    . ONE TOUCH ULTRA TEST test strip     . quinapril (ACCUPRIL) 10 MG tablet Take 1 tablet (10 mg total) by mouth daily. 90 tablet 2  . simethicone (MYLICON) 125 MG chewable tablet Chew 125 mg by mouth every 6 (six) hours as needed.      . traZODone (DESYREL) 50 MG tablet Take 1 tablet (50 mg total) by mouth at bedtime. 30 tablet 5  . vitamin C (ASCORBIC ACID) 500 MG tablet Take 500 mg by mouth daily.    Marland Kitchen. zinc gluconate 50 MG tablet Take 50 mg by mouth daily.    . betamethasone dipropionate (DIPROLENE) 0.05 % cream Apply topically 2 (two) times daily.     . carbidopa-levodopa (SINEMET IR) 25-100 MG per tablet TAKE 1 1/2 TABLETS BY MOUTH THREE TIMES DAILY 180 tablet 6   No facility-administered medications prior to visit.     PHYSICAL EXAM  Filed Vitals:   03/14/14 1117  BP: 124/73  Pulse: 83  Height: 5\' 3"  (1.6 m)  Weight: 154 lb 6.4 oz (70.035 kg)   Body mass index is 27.36 kg/(m^2). No exam data present  Neurologic Exam  Mental Status: Awake, alert. Language is fluent and comprehension intact. NEG MYERSONS. NEG SNOUT. MASKED FACIES. Cranial Nerves: Pupils are equal and reactive to light. Visual fields are full to confrontation. Conjugate eye movements are full  and symmetric; EXCEPT FOR RIGHT EXO/HYPERTROPIA. Facial sensation and strength are symmetric. Hearing is intact. Palate elevated symmetrically and uvula is midline. Shoulder shrug is symmetric. Tongue is midline.  Motor: Normal bulk. MILD ACTION/POSTURAL. NO REST TREMOR. BRADYKINESIA IN LUE>RUE. COGWHEELING IN LUE. Full strength in the upper and lower extremities. No pronator drift.  Sensory: Intact and symmetric to light touch and temperature  Coordination: No ataxia or dysmetria on finger-nose or rapid alternating movement testing.  Gait and Station: STOOPED POSTURE. MODERATE ARM SWING.  Reflexes: Deep tendon reflexes in the upper and lower extremity are present and symmetric; TRACE AT ANKLES.    DIAGNOSTIC DATA (LABS, IMAGING, TESTING) - I reviewed patient records, labs, notes, testing and imaging myself where available.  Lab Results  Component Value Date   HGBA1C 6.1 02/10/2014   Lab Results  Component Value Date   VITAMINB12 546 10/21/2011   Lab Results  Component Value Date   TSH 0.70 02/10/2014   11/19/11 MRI of brain -  multiple round and ovoid, periventricular, subcortical, thalamic and pontine foci of T2 hyperintensity likely representative of chronic small vessel ischemic disease.    ASSESSMENT AND PLAN  68 y.o. 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 She has 4 of the 4 cardinal features for Parkinson's disease. On carb/levo 25/100 (1.5 tabs BID -TID).  Dx: idiopathic parkinson's disease + major depresssion   PLAN:  1. Increase carbidopa-levodopa up to 2 tabs BID -TID   Return in about 6 months (around 09/13/2014).  Suanne Marker, MD 03/14/2014, 12:12 PM Certified in Neurology, Neurophysiology and Neuroimaging  [facility] [address], [address] [place], [place] [address] [phone]

## 2014-03-16 ENCOUNTER — Other Ambulatory Visit: Payer: Self-pay | Admitting: Internal Medicine

## 2014-05-03 ENCOUNTER — Telehealth: Payer: Self-pay | Admitting: Diagnostic Neuroimaging

## 2014-05-03 NOTE — Telephone Encounter (Signed)
Patient is calling because she is having problems with Parkinson's. Patient is not able to eat like she should and is having problems with her nerves. Please call.

## 2014-05-04 ENCOUNTER — Encounter: Payer: Self-pay | Admitting: *Deleted

## 2014-05-04 ENCOUNTER — Telehealth: Payer: Self-pay | Admitting: *Deleted

## 2014-05-04 DIAGNOSIS — F32A Depression, unspecified: Secondary | ICD-10-CM

## 2014-05-04 DIAGNOSIS — F329 Major depressive disorder, single episode, unspecified: Secondary | ICD-10-CM

## 2014-05-04 DIAGNOSIS — G20 Parkinson's disease: Secondary | ICD-10-CM

## 2014-05-04 NOTE — Telephone Encounter (Signed)
Spoke with the pt on the phone in regards to her call yesterday 05/03/13, asked her what she was feeling exactly. She stated that she was feeling sad and tearful and like she could not pull her self up. I told her that Dr. [name] suggested that she cut back to 1 and a half pills three times a day instead of 2 pills three times a day.  I asked her to try this for a week or two and if she did not feel any better to call me back and make an appointment to see Dr. [name]. I asked her if she was feeling suicidial, like she wanted to harm her self orif she felt like she did not want to be alive anymore and she said no.  I told her that if she started to have those feelings she needed to be seen by her PCP ASAP. She stated an understanding and a thanks for being so prompt. I told her that I would call back in about a week and check on her again.

## 2014-05-04 NOTE — Telephone Encounter (Signed)
I called Barbara Bradshaw. Now feeling more down and sad over last 3-4 months. Crying all the time. Possibly worse since increasing carb/levo dose. Agree with lowering the dose. Also will refer her back to a new psychiatrist. She agrees with plan.   Suanne MarkerVIKRAM R. Alica Shellhammer, MD 05/04/2014, 10:04 AM Certified in Neurology, Neurophysiology and Neuroimaging  [facility] [address], [address] [place], [place] [id number] [phone]

## 2014-05-04 NOTE — Telephone Encounter (Signed)
-----   Message from Elise BenneSamantha Allen, RN sent at 05/04/2014  8:25 AM EST ----- Just wanted to let you know that I spoke with the pt and that she states that she feels very down and tearful and sad. I asked her to cut back to 1.5 pills of carbidopa and she said ok.  I reviewed her other medications and she said she is still taking her wellbutrin XR 150mg  daily. I also asked her if she was feeling suicidal or wanted to harm herself and she said no. I just wanted to let you know. I told her if nothing changed in about 2 weeks to come back to see you or if she felt suicidal she needed to get into see her PCP ASAP.

## 2014-06-04 ENCOUNTER — Other Ambulatory Visit: Payer: Self-pay | Admitting: Internal Medicine

## 2014-06-12 ENCOUNTER — Ambulatory Visit: Payer: Medicare Other | Admitting: Internal Medicine

## 2014-07-10 ENCOUNTER — Telehealth: Payer: Self-pay | Admitting: Diagnostic Neuroimaging

## 2014-07-10 ENCOUNTER — Ambulatory Visit (HOSPITAL_COMMUNITY)
Admission: RE | Admit: 2014-07-10 | Discharge: 2014-07-10 | Disposition: A | Discharge status: Home / Self Care | Payer: Medicare Other | Source: Ambulatory Visit | Attending: Physician Assistant | Admitting: Physician Assistant

## 2014-07-10 ENCOUNTER — Ambulatory Visit (INDEPENDENT_AMBULATORY_CARE_PROVIDER_SITE_OTHER): Payer: Medicare Other | Admitting: Family Medicine

## 2014-07-10 ENCOUNTER — Ambulatory Visit (INDEPENDENT_AMBULATORY_CARE_PROVIDER_SITE_OTHER): Payer: Medicare Other

## 2014-07-10 VITALS — BP 120/70 | HR 74 | Temp 97.9°F | Resp 18 | Ht 63.0 in | Wt 147.4 lb

## 2014-07-10 DIAGNOSIS — I10 Essential (primary) hypertension: Secondary | ICD-10-CM | POA: Insufficient documentation

## 2014-07-10 DIAGNOSIS — E785 Hyperlipidemia, unspecified: Secondary | ICD-10-CM | POA: Insufficient documentation

## 2014-07-10 DIAGNOSIS — M79642 Pain in left hand: Secondary | ICD-10-CM | POA: Diagnosis not present

## 2014-07-10 DIAGNOSIS — E119 Type 2 diabetes mellitus without complications: Secondary | ICD-10-CM | POA: Diagnosis not present

## 2014-07-10 DIAGNOSIS — R1110 Vomiting, unspecified: Secondary | ICD-10-CM | POA: Diagnosis not present

## 2014-07-10 DIAGNOSIS — G20 Parkinson's disease: Secondary | ICD-10-CM | POA: Insufficient documentation

## 2014-07-10 DIAGNOSIS — E669 Obesity, unspecified: Secondary | ICD-10-CM | POA: Diagnosis not present

## 2014-07-10 DIAGNOSIS — S0990XA Unspecified injury of head, initial encounter: Secondary | ICD-10-CM | POA: Insufficient documentation

## 2014-07-10 DIAGNOSIS — W19XXXA Unspecified fall, initial encounter: Secondary | ICD-10-CM | POA: Diagnosis not present

## 2014-07-10 DIAGNOSIS — G319 Degenerative disease of nervous system, unspecified: Secondary | ICD-10-CM | POA: Insufficient documentation

## 2014-07-10 DIAGNOSIS — R42 Dizziness and giddiness: Secondary | ICD-10-CM | POA: Diagnosis not present

## 2014-07-10 DIAGNOSIS — J320 Chronic maxillary sinusitis: Secondary | ICD-10-CM | POA: Insufficient documentation

## 2014-07-10 LAB — POCT CBC
GRANULOCYTE PERCENT: 67.7 % (ref 37–80)
HCT, POC: 40.9 % (ref 37.7–47.9)
HEMOGLOBIN: 13.1 g/dL (ref 12.2–16.2)
Lymph, poc: 1.1 (ref 0.6–3.4)
MCH, POC: 28.5 pg (ref 27–31.2)
MCHC: 32 g/dL (ref 31.8–35.4)
MCV: 89 fL (ref 80–97)
MID (cbc): 0.3 (ref 0–0.9)
MPV: 7.5 fL (ref 0–99.8)
PLATELET COUNT, POC: 135 10*3/uL — AB (ref 142–424)
POC GRANULOCYTE: 2.8 (ref 2–6.9)
POC LYMPH PERCENT: 25.9 %L (ref 10–50)
POC MID %: 6.4 %M (ref 0–12)
RBC: 4.59 M/uL (ref 4.04–5.48)
RDW, POC: 13.9 %
WBC: 4.2 10*3/uL — AB (ref 4.6–10.2)

## 2014-07-10 LAB — CREATININE, SERUM
CREATININE: 1.1 mg/dL (ref 0.50–1.10)
GFR calc Af Amer: 58 mL/min — ABNORMAL LOW (ref >90)
GFR, EST NON AFRICAN AMERICAN: 50 mL/min — AB (ref >90)

## 2014-07-10 MED ORDER — GADOBENATE DIMEGLUMINE 529 MG/ML IV SOLN
15.0000 mL | Freq: Once | INTRAVENOUS | Status: AC | PRN
  Administered 2014-07-10: 15 mL via INTRAVENOUS

## 2014-07-10 NOTE — Telephone Encounter (Signed)
Spoke with the pt on the phone who told me that she has had some mild dizziness for the past few months but it has been steadily worsening over the past week or so. She feels very dizzy in the morning and occasionally during the day. Saturday she was in the kitchen and she said that she was putting a box down on the counter and then she woke up with her husband yelling and trying to get her off the floor. She said she did not hurt herself but that she has a knot on her head from the fall. I asked her to get to her PCPs office or an urgent care today if they could not see her. I told her to tell them that she had had a fall. I also told her that they would look her over and rule out anything before she came back to see Dr. [name]. And I told her that I would let him know to see if he wanted to get her into the office sooner than June. She stated an understanding and a thanks. She said she would call back after she was seen

## 2014-07-10 NOTE — Telephone Encounter (Signed)
Patient is calling as she has extreme dizziness and actually fell Saturday.  Patient needs advice as to what to do.  Please call.

## 2014-07-10 NOTE — Progress Notes (Signed)
07/10/2014 at 8:17 PM  Barbara Bradshaw: [birth year] / MRN: [id number]  The patient has Type II diabetes mellitus with manifestations; Hyperlipidemia with target LDL less than 100; Obesity (BMI 30.0-34.9); Essential hypertension; Routine health maintenance; Parkinson's disease; Depression, major; Unspecified constipation; Other dysphagia; Osteopenia, senile; and Allergic rhinitis on her problem list.  SUBJECTIVE  Chief complaint: Head Injury; Hand Injury; and Dizziness   History of present illness: Barbara Bradshaw is 69 y.o. well appearing female with a history of parkinson's disease presenting for a fall that occurred three days ago.  She reports hitting the back of her head during the fall and threw up three times right after the fall.  Her husband tried to help her up immediately but the patient declined reporting "I just felt like laying there." Today she complains of feeling of balance with walking and feels generally weak, and her husband who is with her also reports that she does not seem to be walking normally.  She denies HA, vision and speech changes at this time.    She fell on an outsretched left hand and reports left hand pain at this time.  Her pain is mostly at the base of the thumb.  She denies any significant bruising and swelling of the area since the fall. She reports that someone told her that she needed to take more vitamin D in the past because her bones were weak.   She  has a past medical history of Diabetes mellitus, type 2; Hypertension; Rosacea; Nephrolithiasis; Hyperlipidemia; Parkinson's disease (12/2011); and Parkinson disease.    She  has a current medication list which includes the following prescription(s): adacel, aspirin, atorvastatin, b complex vitamins, bupropion, calcium carbonate, carbidopa-levodopa, cholecalciferol, fish oil-omega-3 fatty acids, fluticasone, furosemide, onetouch ultrasoft, metformin, multivitamin, one touch ultra test, quinapril, simethicone,  trazodone, vitamin c, and zinc gluconate.  Barbara Bradshaw has No Known Allergies. She  reports that she has never smoked. She has never used smokeless tobacco. She reports that she does not drink alcohol or use illicit drugs. She  reports that she does not currently engage in sexual activity. The patient  has past surgical history that includes Tubal ligation and right arm fx with ORIF.  Her family history includes Cancer in her mother; Dementia in her mother; Diabetes in her father; Heart disease in her mother; Hypertension in her mother. There is no history of Colon cancer or Stomach cancer.  Review of Systems  Eyes: Positive for blurred vision. Negative for double vision.  Cardiovascular: Negative.   Gastrointestinal: Negative for nausea and vomiting.  Genitourinary: Negative.   Musculoskeletal: Positive for myalgias and falls. Negative for back pain and neck pain.  Neurological: Positive for tremors. Negative for dizziness, tingling, sensory change, speech change, focal weakness, seizures and loss of consciousness.    OBJECTIVE  Her  height is  (1.6 m) and weight is 147 lb 6.4 oz (66.86 kg). Her oral temperature is 97.9 F (36.6 C). Her blood pressure is 120/70 and her pulse is 74. Her respiration is 18 and oxygen saturation is 98%.  The patient's body mass index is 26.12 kg/(m^2).  Physical Exam  Constitutional: She is oriented to person, place, and time. She appears well-developed and well-nourished. She does not appear ill. No distress.  Eyes: EOM are normal.  Cardiovascular: Normal rate and regular rhythm.   Respiratory: Effort normal and breath sounds normal.  Neurological: She is alert and oriented to person, place, and time. She displays tremor. No cranial  nerve deficit or sensory deficit. She exhibits normal muscle tone. Gait abnormal. Coordination normal.  Shuffling gait noted.  Sharp and dull intact to challenge.  Strength 5/5 throughout.    Skin: Skin is warm and dry. She is  not diaphoretic.  Psychiatric: She has a normal mood and affect. Her behavior is normal. Judgment and thought content normal.    Results for orders placed or performed in visit on 07/10/14 (from the past 24 hour(s))  POCT CBC     Status: Abnormal   Collection Time: 07/10/14  2:17 PM  Result Value Ref Range   WBC 4.2 (A) 4.6 - 10.2 K/uL   Lymph, poc 1.1 0.6 - 3.4   POC LYMPH PERCENT 25.9 10 - 50 %L   MID (cbc) 0.3 0 - 0.9   POC MID % 6.4 0 - 12 %M   POC Granulocyte 2.8 2 - 6.9   Granulocyte percent 67.7 37 - 80 %G   RBC 4.59 4.04 - 5.48 M/uL   Hemoglobin 13.1 12.2 - 16.2 g/dL   HCT, POC 16.140.9 09.637.7 - 47.9 %   MCV 89.0 80 - 97 fL   MCH, POC 28.5 27 - 31.2 pg   MCHC 32.0 31.8 - 35.4 g/dL   RDW, POC 04.513.9 %   Platelet Count, POC 135 (A) 142 - 424 K/uL   MPV 7.5 0 - 99.8 fL   [facility] reading (PRIMARY) by  Dr. Milus GlazierLauenstein: Negative for fracture. Extensive arthritis.    MRI: Negative for intracranial process.   ASSESSMENT & PLAN  Barbara Bradshaw was seen today for head injury, hand injury and dizziness.  Diagnoses and all orders for this visit:  Head injury, initial encounter: MRI negative for acute changes and intracranial process.  Patient to f/u in two days for a recheck.  Of note, patient was unable to provide urine sample in the office.  Will attempt to collect this at follow up as well.  Additionally, patient has been complaining of dizziness and air fluid levels noted on MRI.  Per my phone conversation with her, she does not endorse feeling more or less stuffy than normal. She does have a history of allergies that flare during this time of year. Advised that she take Claritin for two days and will consider starting an antibiotic should this fail to alleviate her symptoms.  Orders: -     Cancel: MR Brain W Wo Contrast; Future -     MR Brain W Wo Contrast; Future  Left hand pain Orders: -     DG Hand Complete Left; Future -     DG Wrist Complete Left; Future  Fall, initial  encounter Orders: -     Protime-INR -     Cancel: POCT urinalysis dipstick -     Cancel: POCT UA - Microscopic Only -     POCT CBC    The patient was advised to call or come back to clinic if she does not see an improvement in symptoms, or worsens with the above plan.   Deliah BostonMichael Kaniel Kiang, MHS, PA-C [facility] 07/10/2014 8:17 PM

## 2014-07-11 LAB — PROTIME-INR
INR: 1.03 (ref <1.50)
Prothrombin Time: 13.5 seconds (ref 11.6–15.2)

## 2014-07-12 ENCOUNTER — Ambulatory Visit (INDEPENDENT_AMBULATORY_CARE_PROVIDER_SITE_OTHER): Payer: Medicare Other | Admitting: Physician Assistant

## 2014-07-12 VITALS — BP 110/60 | HR 79 | Temp 98.0°F | Resp 18 | Ht 63.0 in | Wt 148.4 lb

## 2014-07-12 DIAGNOSIS — J300 Vasomotor rhinitis: Secondary | ICD-10-CM

## 2014-07-12 DIAGNOSIS — R42 Dizziness and giddiness: Secondary | ICD-10-CM | POA: Diagnosis not present

## 2014-07-12 DIAGNOSIS — H6121 Impacted cerumen, right ear: Secondary | ICD-10-CM | POA: Diagnosis not present

## 2014-07-12 DIAGNOSIS — J0100 Acute maxillary sinusitis, unspecified: Secondary | ICD-10-CM | POA: Diagnosis not present

## 2014-07-12 LAB — POCT UA - MICROSCOPIC ONLY
BACTERIA, U MICROSCOPIC: NEGATIVE
CRYSTALS, UR, HPF, POC: NEGATIVE
Casts, Ur, LPF, POC: NEGATIVE
Mucus, UA: NEGATIVE
RBC, urine, microscopic: NEGATIVE
Yeast, UA: NEGATIVE

## 2014-07-12 LAB — POCT URINALYSIS DIPSTICK
Bilirubin, UA: NEGATIVE
Blood, UA: NEGATIVE
Glucose, UA: NEGATIVE
Nitrite, UA: NEGATIVE
PH UA: 6
Spec Grav, UA: 1.015
Urobilinogen, UA: 0.2

## 2014-07-12 MED ORDER — AMOXICILLIN 500 MG PO CAPS: 500.0000 mg | ORAL_CAPSULE | Freq: Three times a day (TID) | ORAL | Status: AC

## 2014-07-12 MED ORDER — IPRATROPIUM BROMIDE 0.03 % NA SOLN: 2.0000 | Freq: Two times a day (BID) | NASAL | Status: DC

## 2014-07-12 NOTE — Progress Notes (Signed)
07/12/2014 at 8:27 PM  Barbara Bradshaw / DOB: 1945-04-01 / MRN: [id number]  The patient has Type II diabetes mellitus with manifestations; Hyperlipidemia with target LDL less than 100; Obesity (BMI 30.0-34.9); Essential hypertension; Routine health maintenance; Parkinson's disease; Depression, major; Unspecified constipation; Other dysphagia; Osteopenia, senile; and Allergic rhinitis on her problem list.  SUBJECTIVE  Chief complaint: Follow-up   History of present illness: Barbara Bradshaw is 69 y.o. well appearing female with a history of parkinson's disease presenting for a follow up of a fall that occurred 5 days ago. She was seen at [facility] two days ago and was complaining of dizziness, so an MRI was ordered and was negative for intracranial process, however it did show air fluid levels.  She was placed on Claritin after the results of the MRI were received by Thereasa Parkin, and reports that she is still mildly dizzy.    She complains that her nose is always running.  She has tried flonase in the past for one month and reports it made no difference in her symptoms.   She  has a past medical history of Diabetes mellitus, type 2; Hypertension; Rosacea; Nephrolithiasis; Hyperlipidemia; Parkinson's disease (12/2011); and Parkinson disease.    She  has a current medication list which includes the following prescription(s): adacel, aspirin, atorvastatin, b complex vitamins, bupropion, calcium carbonate, carbidopa-levodopa, cholecalciferol, fish oil-omega-3 fatty acids, fluticasone, furosemide, onetouch ultrasoft, metformin, multivitamin, one touch ultra test, quinapril, simethicone, trazodone, vitamin c, zinc gluconate, amoxicillin, and ipratropium.  Barbara Bradshaw has No Known Allergies. She  reports that she has never smoked. She has never used smokeless tobacco. She reports that she does not drink alcohol or use illicit drugs. She  reports that she does not currently engage in sexual activity. The patient  has past  surgical history that includes Tubal ligation and right arm fx with ORIF.  Her family history includes Cancer in her mother; Dementia in her mother; Diabetes in her father; Heart disease in her mother; Hypertension in her mother. There is no history of Colon cancer or Stomach cancer.  Review of Systems  Constitutional: Negative for fever.  HENT: Positive for congestion. Negative for sore throat.   Respiratory: Negative for cough.   Cardiovascular: Negative for chest pain and palpitations.  Gastrointestinal: Negative.   Genitourinary: Negative for dysuria, urgency, frequency, hematuria and flank pain.  Musculoskeletal: Negative for myalgias.  Skin: Negative.   Neurological: Positive for dizziness. Negative for headaches.    OBJECTIVE  Her  height is  (1.6 m) and weight is 148 lb 6.4 oz (67.314 kg). Her oral temperature is 98 F (36.7 C). Her blood pressure is 110/60 and her pulse is 79. Her respiration is 18 and oxygen saturation is 98%.  The patient's body mass index is 26.29 kg/(m^2).  Physical Exam  Constitutional: She is oriented to person, place, and time. She appears well-developed and well-nourished. She does not appear ill. No distress.  HENT:  Left Ear: Hearing, tympanic membrane, external ear and ear canal normal.  Ears:  Eyes: EOM are normal.  Cardiovascular: Normal rate and regular rhythm.   Respiratory: Effort normal and breath sounds normal.  Neurological: She is alert and oriented to person, place, and time. She displays tremor. No cranial nerve deficit or sensory deficit. She exhibits normal muscle tone. Gait abnormal. Coordination normal.  Shuffling gait noted.  Sharp and dull intact to challenge.  Strength 5/5 throughout.    Skin: Skin is warm and dry. She is not diaphoretic.  Psychiatric: She has a normal mood and affect. Her behavior is normal. Judgment and thought content normal.        ASSESSMENT & PLAN  Barbara Bradshaw was seen today for head injury, hand  injury and dizziness.  Barbara Bradshaw was seen today for follow-up.  Diagnoses and all orders for this visit:  Acute maxillary sinusitis, recurrence not specified: Diagnosed from right maxillary air fluid level on MRI.  She has taken amox in the past five years and tolerated this without difficulty.  Orders: -     amoxicillin (AMOXIL) 500 MG capsule; Take 1 capsule (500 mg total) by mouth 3 (three) times daily.  Vasomotor rhinitis Orders: -     ipratropium (ATROVENT) 0.03 % nasal spray; Place 2 sprays into both nostrils 2 (two) times daily.  Dizziness Orders: -     POCT urinalysis dipstick -     POCT UA - Microscopic Only  Cerumen impaction, right        -    Ear lavage in office successful.  TM pearly gray.  Could potentially explain problem 3.      Deliah BostonMichael Clark, MHS, PA-C [facility] 07/12/2014 8:27 PM

## 2014-08-20 ENCOUNTER — Other Ambulatory Visit: Payer: Self-pay | Admitting: Internal Medicine

## 2014-09-06 ENCOUNTER — Other Ambulatory Visit: Payer: Self-pay | Admitting: Internal Medicine

## 2014-09-12 ENCOUNTER — Encounter: Payer: Self-pay | Admitting: Internal Medicine

## 2014-09-12 ENCOUNTER — Ambulatory Visit (INDEPENDENT_AMBULATORY_CARE_PROVIDER_SITE_OTHER): Payer: Medicare Other | Admitting: Internal Medicine

## 2014-09-12 ENCOUNTER — Other Ambulatory Visit (INDEPENDENT_AMBULATORY_CARE_PROVIDER_SITE_OTHER): Payer: Medicare Other

## 2014-09-12 VITALS — BP 124/74 | HR 64 | Temp 98.1°F | Resp 16 | Ht 63.0 in | Wt 148.8 lb

## 2014-09-12 DIAGNOSIS — E785 Hyperlipidemia, unspecified: Secondary | ICD-10-CM | POA: Diagnosis not present

## 2014-09-12 DIAGNOSIS — K5909 Other constipation: Secondary | ICD-10-CM

## 2014-09-12 DIAGNOSIS — I10 Essential (primary) hypertension: Secondary | ICD-10-CM

## 2014-09-12 DIAGNOSIS — E118 Type 2 diabetes mellitus with unspecified complications: Secondary | ICD-10-CM

## 2014-09-12 LAB — HEMOGLOBIN A1C: Hgb A1c MFr Bld: 6.4 % (ref 4.6–6.5)

## 2014-09-12 LAB — COMPREHENSIVE METABOLIC PANEL
ALT: 5 U/L (ref 0–35)
AST: 17 U/L (ref 0–37)
Albumin: 4.4 g/dL (ref 3.5–5.2)
Alkaline Phosphatase: 65 U/L (ref 39–117)
BUN: 15 mg/dL (ref 6–23)
CHLORIDE: 101 meq/L (ref 96–112)
CO2: 34 mEq/L — ABNORMAL HIGH (ref 19–32)
Calcium: 10.3 mg/dL (ref 8.4–10.5)
Creatinine, Ser: 1.07 mg/dL (ref 0.40–1.20)
GFR: 53.94 mL/min — AB (ref >60.00)
GLUCOSE: 141 mg/dL — AB (ref 70–99)
Potassium: 4.9 mEq/L (ref 3.5–5.1)
SODIUM: 137 meq/L (ref 135–145)
TOTAL PROTEIN: 6.8 g/dL (ref 6.0–8.3)
Total Bilirubin: 0.7 mg/dL (ref 0.2–1.2)

## 2014-09-12 LAB — TSH: TSH: 0.87 u[IU]/mL (ref 0.35–4.50)

## 2014-09-12 MED ORDER — LINACLOTIDE 145 MCG PO CAPS: 145.0000 ug | ORAL_CAPSULE | Freq: Every day | ORAL | Status: DC

## 2014-09-12 NOTE — Progress Notes (Signed)
Pre visit review using our clinic review tool, if applicable. No additional management support is needed unless otherwise documented below in the visit note. 

## 2014-09-12 NOTE — Progress Notes (Signed)
Subjective:  Patient ID: Barbara Bradshaw, female    DOB: [birth year]  Age: 69 y.o. MRN: [id number]  CC: Hypertension; Diabetes; Hyperlipidemia; and Constipation   HPI Barbara Bradshaw presents for constipation, she only has a BM once every 1-2 weeks and feels bloated and uncomfortable.  Outpatient Prescriptions Prior to Visit  Medication Sig Dispense Refill  . ADACEL 07-30-13.5 LF-MCG/0.5 injection     . aspirin 81 MG tablet Take 81 mg by mouth daily.      Marland Kitchen atorvastatin (LIPITOR) 40 MG tablet TAKE 1 TABLET BY MOUTH DAILY 90 tablet 3  . b complex vitamins capsule Take 1 capsule by mouth daily.    Marland Kitchen buPROPion (WELLBUTRIN XL) 150 MG 24 hr tablet TAKE 1 TABLET BY MOUTH EVERY DAY 30 tablet 11  . calcium carbonate (OS-CAL) 600 MG TABS Take 600 mg by mouth daily.      . carbidopa-levodopa (SINEMET IR) 25-100 MG per tablet Take 2 tablets by mouth 3 (three) times daily. 540 tablet 4  . cholecalciferol (VITAMIN D) 1000 UNITS tablet Take 1,000 Units by mouth daily.    . fish oil-omega-3 fatty acids 1000 MG capsule Take 1 g by mouth daily.    . furosemide (LASIX) 40 MG tablet TAKE 1 TABLET BY MOUTH EVERY DAY 90 tablet 1  . ipratropium (ATROVENT) 0.03 % nasal spray Place 2 sprays into both nostrils 2 (two) times daily. 30 mL 0  . Lancets (ONETOUCH ULTRASOFT) lancets Use as instructed 100 each 12  . Multiple Vitamin (MULTIVITAMIN) tablet Take 1 tablet by mouth daily.    . ONE TOUCH ULTRA TEST test strip     . quinapril (ACCUPRIL) 10 MG tablet TAKE 1 TABLET BY MOUTH DAILY 90 tablet 3  . simethicone (MYLICON) 125 MG chewable tablet Chew 125 mg by mouth every 6 (six) hours as needed.      . traZODone (DESYREL) 50 MG tablet TAKE 1 TABLET BY MOUTH AT BEDTIME 30 tablet 11  . vitamin C (ASCORBIC ACID) 500 MG tablet Take 500 mg by mouth daily.    Marland Kitchen zinc gluconate 50 MG tablet Take 50 mg by mouth daily.    . fluticasone (FLONASE) 50 MCG/ACT nasal spray Place 2 sprays into both nostrils daily. (Patient not  taking: Reported on 09/12/2014) 16 g 11  . metFORMIN (GLUCOPHAGE) 1000 MG tablet TAKE 1 TABLET BY MOUTH TWICE DAILY (Patient not taking: Reported on 09/12/2014) 180 tablet 3   No facility-administered medications prior to visit.    ROS Review of Systems  Constitutional: Negative.  Negative for fever, chills, diaphoresis, appetite change and fatigue.  HENT: Negative.  Negative for trouble swallowing and voice change.   Eyes: Negative.   Respiratory: Negative.  Negative for cough, choking, chest tightness, shortness of breath and stridor.   Cardiovascular: Negative.  Negative for chest pain, palpitations and leg swelling.  Gastrointestinal: Positive for constipation. Negative for nausea, abdominal pain, diarrhea, blood in stool, abdominal distention, anal bleeding and rectal pain.  Endocrine: Negative.   Genitourinary: Negative.  Negative for difficulty urinating.  Musculoskeletal: Positive for gait problem. Negative for myalgias, back pain, joint swelling and arthralgias.  Skin: Negative.  Negative for rash.  Allergic/Immunologic: Negative.   Neurological: Positive for tremors. Negative for dizziness, syncope, speech difficulty, weakness, light-headedness, numbness and headaches.  Hematological: Negative.  Negative for adenopathy.  Psychiatric/Behavioral: Negative.     Objective:  BP 124/74 mmHg  Pulse 64  Temp(Src) 98.1 F (36.7 C) (Oral)  Resp 16  Ht  5\' 3"  (1.6 m)  Wt 148 lb 12 oz (67.473 kg)  BMI 26.36 kg/m2  SpO2 97%  BP Readings from Last 3 Encounters:  09/12/14 124/74  07/12/14 110/60  07/10/14 120/70    Wt Readings from Last 3 Encounters:  09/12/14 148 lb 12 oz (67.473 kg)  07/12/14 148 lb 6.4 oz (67.314 kg)  07/10/14 147 lb 6.4 oz (66.86 kg)    Physical Exam  Constitutional: She is oriented to person, place, and time. She appears well-developed and well-nourished. No distress.  HENT:  Head: Normocephalic and atraumatic.  Mouth/Throat: Oropharynx is clear and  moist. No oropharyngeal exudate.  Eyes: Conjunctivae are normal. Right eye exhibits no discharge. Left eye exhibits no discharge. No scleral icterus.  Neck: Normal range of motion. Neck supple. No JVD present. No tracheal deviation present. No thyromegaly present.  Cardiovascular: Normal rate, regular rhythm, normal heart sounds and intact distal pulses.  Exam reveals no gallop and no friction rub.   No murmur heard. Pulmonary/Chest: Effort normal and breath sounds normal. No stridor. No respiratory distress. She has no wheezes. She has no rales. She exhibits no tenderness.  Abdominal: Soft. Bowel sounds are normal. She exhibits no distension and no mass. There is no tenderness. There is no rebound and no guarding.  Musculoskeletal: Normal range of motion. She exhibits no edema or tenderness.  Lymphadenopathy:    She has no cervical adenopathy.  Neurological: She is oriented to person, place, and time.  Skin: Skin is warm and dry. No rash noted. She is not diaphoretic. No erythema. No pallor.  Psychiatric: She has a normal mood and affect. Her behavior is normal. Judgment and thought content normal.  Vitals reviewed.   Lab Results  Component Value Date   WBC 4.2* 07/10/2014   HGB 13.1 07/10/2014   HCT 40.9 07/10/2014   PLT 179.0 02/10/2014   GLUCOSE 141* 09/12/2014   CHOL 94 02/10/2014   TRIG 52.0 02/10/2014   HDL 44.30 02/10/2014   LDLDIRECT 40.1 03/09/2007   LDLCALC 39 02/10/2014   ALT 5 09/12/2014   AST 17 09/12/2014   NA 137 09/12/2014   K 4.9 09/12/2014   CL 101 09/12/2014   CREATININE 1.07 09/12/2014   BUN 15 09/12/2014   CO2 34* 09/12/2014   TSH 0.87 09/12/2014   INR 1.03 07/10/2014   HGBA1C 6.4 09/12/2014   MICROALBUR 3.9* 02/10/2014    Dg Wrist Complete Left  07/10/2014   CLINICAL DATA:  Initial encounter for left wrist injury from a fall 3 days ago.  EXAM: LEFT WRIST - COMPLETE 3+ VIEW  COMPARISON:  Hand films of 07/10/2014, dictated separately.  FINDINGS:  Radiocarpal osteoarthritis with joint space narrowing and subchondral sclerosis. No acute fracture or dislocation. Scaphoid intact.  IMPRESSION: Degenerative change, without acute osseous finding.   Electronically Signed   By: Jeronimo Greaves M.D.   On: 07/10/2014 15:30   Mr Laqueta Jean YJ Contrast  07/10/2014   CLINICAL DATA:  Fall 3 days ago. Dizziness. Diabetes. Hyperlipidemia. Obesity. Hypertension. Parkinson's. Vomiting.  EXAM: MRI HEAD WITHOUT AND WITH CONTRAST  TECHNIQUE: Multiplanar, multiecho pulse sequences of the brain and surrounding structures were obtained without and with intravenous contrast.  CONTRAST:  62mL MULTIHANCE GADOBENATE DIMEGLUMINE 529 MG/ML IV SOLN  COMPARISON:  MRI 11/19/2011  FINDINGS: Mild atrophy similar to the prior study.  Negative for hydrocephalus  Negative for acute infarct.  Chronic microvascular ischemic changes in the white matter similar to the prior study. 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 Mild chronic ischemia in the left pons.  Negative for intracranial hemorrhage.  Negative for mass or edema.  Normal enhancement following contrast infusion. No enhancing mass lesion.  Sinusitis with air-fluid level right maxillary sinus. Diffuse mucosal thickening in the paranasal sinuses.  IMPRESSION: Atrophy and moderate chronic ischemic change.  No acute infarct.  Sinusitis with air-fluid level right maxillary sinus.   Electronically Signed   By: Marlan Palau M.D.   On: 07/10/2014 18:47   Dg Hand Complete Left  07/10/2014   CLINICAL DATA:  Pain following fall on outstretched hand  EXAM: LEFT HAND - COMPLETE 3+ VIEW  COMPARISON:  None.  FINDINGS: Frontal, oblique, and lateral views were obtained. There is no fracture or dislocation. There is moderate narrowing of all MCP, PIP, and DIP joints. There is also osteoarthritic change in the saddle joint. No erosive change or periostitis.  IMPRESSION: Multifocal osteoarthritic change.  No fracture or  dislocation.   Electronically Signed   By: Bretta Bang III M.D.   On: 07/10/2014 15:27    Assessment & Plan:   Tyronza was seen today for hypertension, diabetes, hyperlipidemia and constipation.  Diagnoses and all orders for this visit:  Type II diabetes mellitus with manifestations - her blood sugars are very well controlled Orders: -     Cancel: Basic metabolic panel; Future -     Cancel: Hemoglobin A1c; Future -     Comprehensive metabolic panel; Future -     Hemoglobin A1c; Future  Essential hypertension - her BP is well controlled, lytes and renal function are stable Orders: -     Cancel: Basic metabolic panel; Future -     Comprehensive metabolic panel; Future  Hyperlipidemia with target LDL less than 100 Orders: -     Comprehensive metabolic panel; Future -     TSH; Future  Other constipation - her labs do not reveal any secondary causes for constipation, this is most likely related to the parkinson's dz., will start linzess Orders: -     Comprehensive metabolic panel; Future -     TSH; Future -     Linaclotide (LINZESS) 145 MCG CAPS capsule; Take 1 capsule (145 mcg total) by mouth daily.   I am having Ms. Giglia start on Linaclotide. I am also having her maintain her aspirin, calcium carbonate, simethicone, multivitamin, b complex vitamins, vitamin C, cholecalciferol, fish oil-omega-3 fatty acids, zinc gluconate, ONE TOUCH ULTRA TEST, onetouch ultrasoft, metFORMIN, ADACEL, fluticasone, carbidopa-levodopa, buPROPion, quinapril, atorvastatin, ipratropium, furosemide, and traZODone.  Meds ordered this encounter  Medications  . Linaclotide (LINZESS) 145 MCG CAPS capsule    Sig: Take 1 capsule (145 mcg total) by mouth daily.    Dispense:  30 capsule    Refill:  11     Follow-up: Return in about 4 months (around 01/12/2015).  Sanda Linger, MD

## 2014-09-12 NOTE — Patient Instructions (Signed)

## 2014-09-13 ENCOUNTER — Encounter: Payer: Self-pay | Admitting: Internal Medicine

## 2014-09-19 ENCOUNTER — Encounter: Payer: Self-pay | Admitting: Diagnostic Neuroimaging

## 2014-09-19 ENCOUNTER — Ambulatory Visit (INDEPENDENT_AMBULATORY_CARE_PROVIDER_SITE_OTHER): Payer: Medicare Other | Admitting: Diagnostic Neuroimaging

## 2014-09-19 VITALS — BP 140/81 | HR 82 | Ht 63.0 in | Wt 147.2 lb

## 2014-09-19 DIAGNOSIS — G20 Parkinson's disease: Secondary | ICD-10-CM

## 2014-09-19 DIAGNOSIS — R55 Syncope and collapse: Secondary | ICD-10-CM

## 2014-09-19 NOTE — Progress Notes (Signed)
PATIENT: Barbara Bradshaw DOB: 1945/08/07  REASON FOR VISIT: routine follow up for PD HISTORY FROM: patient, husband  Chief Complaint  Patient presents with  . Parkinson's    rm 6, husband  . Follow-up    HISTORY OF PRESENT ILLNESS:  UPDATE 09/19/14: Since last visit, she had episode of passing out at home in July 08, 2014; was standing in kitchen, then passed out. Hit head on concrete slab flooring. LOC was witnessed by husband. Husband helped her sit up, and patient gradually woke up. She tried drinking OJ, but spit it out and vomited. They checked sugar, and it was 123. Went to urgent care on 07/10/14. They check MRI brain, and dx'd sinusitis and treated with abx. Otherwise PD symptoms are stable. Now on carb/levo 2 tabs TID.  UPDATE 03/14/14: Since last visit, right hand shaking more. Still struggles with back pain. No falls. Some balance issues. On carb/levo 25/100 (1.5 tabs BID; sometimes takes extra meds in mid day if has more tremor). More slowness/stiffness in early AM. Also notes more intermittent cold intolerance randomly through the day.  UPDATE 09/12/13 (LL): Since last visit, patient has started back into therapy and her depression is better under control.  Parkinson's disease symptoms are stable.  She has chronic constipation, only 1 BM/week. HH that was ordered at last visit did not occur, they never received any call. Carb/Levo is at same dose, misses mid-day dose occasionally. She has more back pain lately, she feels it is from compression fx that occurred in 2012.  UPDATE 05/25/13: Since last visit, multiple family tragedies: nephew and mother passed away. This has led to deep depression. Patient continues to struggle on daily basis, with fatigue, depression, crying. Other parkinson's dz symptoms are stable. Still taking carb/levo, but misses mid-day dose many times.   UPDATE 03/14/13: Since last visit, doing well. Some wearing off symptoms noted when she wakes up, but not  later in the day. Tolerating carb/levo 25/100, 1/5 tabs TID. No falls. She has some swallowing issues (feels food caught in mid-lower esophagus). Some nerve/anxiety issues, but manageable.   UPDATE 08/03/12: since last visit no new changes. Patient's tremor is under good control with her carbidopa/levodopa. Sometimes she forgets to take the midday dose. She takes her medications regularly, it seems to work well. If she misses a dose, she notices wearing off. Patient also reports mild short-term memory loss. No anxiety. Some constipation. I noted some oral-lingual dyskinesias today, which patient does not notice. Husband has noticed this. She attributes this to her ill fitting dentures.   UPDATE 03/08/12: Since last visit, doing better. Increased carb/levo seems to work better. Some off-time in early AM, otherise no waearing off. No side effects. No anxiety, constipation or falls. some swallow diff, but mild.   UPDATE 01/01/12: She feels like carbidopa-levodopa has helped 15%. She is tolerating carbidopa-levodopa well without dizziness, she has had occasional nausea. She takes her midday dose with her meals. Denies any falls. No change in smell and taste, bowel or bladder.   PRIOR HPI (Dr. Marjory Lies) 69 year old right-handed female with history of diabetes, hypertension, hyperlipidemia, here for evaluation of tremor for the past one year. Patient noted intermittent tremor when she is doing certain actions such as handwriting, cooking, holding objects. Her husband has noted some intermittent resting tremor. Patient fell down in June 2012. She feels that her balance and walking have slightly worsened over time. Husband has noticed change in her voice. Patient having a little bit more  anxiety, as well as very vivid dreams lately. Patient has family history Parkinson's disease in her sister and her father. No change in sense of smell or taste.   REVIEW OF SYSTEMS: Full 14 system review of systems performed and  notable only for depression chills eye itching trouble swallowing back pain walking diff anxiety memory loss dizziness back pain.    ALLERGIES: No Known Allergies  HOME MEDICATIONS: Outpatient Prescriptions Prior to Visit  Medication Sig Dispense Refill  . ADACEL 07-30-13.5 LF-MCG/0.5 injection     . aspirin 81 MG tablet Take 81 mg by mouth daily.      Marland Kitchen atorvastatin (LIPITOR) 40 MG tablet TAKE 1 TABLET BY MOUTH DAILY 90 tablet 3  . b complex vitamins capsule Take 1 capsule by mouth daily.    Marland Kitchen buPROPion (WELLBUTRIN XL) 150 MG 24 hr tablet TAKE 1 TABLET BY MOUTH EVERY DAY 30 tablet 11  . calcium carbonate (OS-CAL) 600 MG TABS Take 600 mg by mouth daily.      . carbidopa-levodopa (SINEMET IR) 25-100 MG per tablet Take 2 tablets by mouth 3 (three) times daily. 540 tablet 4  . cholecalciferol (VITAMIN D) 1000 UNITS tablet Take 1,000 Units by mouth daily.    . fish oil-omega-3 fatty acids 1000 MG capsule Take 1 g by mouth daily.    . fluticasone (FLONASE) 50 MCG/ACT nasal spray Place 2 sprays into both nostrils daily. 16 g 11  . furosemide (LASIX) 40 MG tablet TAKE 1 TABLET BY MOUTH EVERY DAY 90 tablet 1  . ipratropium (ATROVENT) 0.03 % nasal spray Place 2 sprays into both nostrils 2 (two) times daily. 30 mL 0  . Lancets (ONETOUCH ULTRASOFT) lancets Use as instructed 100 each 12  . Linaclotide (LINZESS) 145 MCG CAPS capsule Take 1 capsule (145 mcg total) by mouth daily. 30 capsule 11  . metFORMIN (GLUCOPHAGE) 1000 MG tablet TAKE 1 TABLET BY MOUTH TWICE DAILY 180 tablet 3  . Multiple Vitamin (MULTIVITAMIN) tablet Take 1 tablet by mouth daily.    . ONE TOUCH ULTRA TEST test strip     . quinapril (ACCUPRIL) 10 MG tablet TAKE 1 TABLET BY MOUTH DAILY 90 tablet 3  . simethicone (MYLICON) 125 MG chewable tablet Chew 125 mg by mouth every 6 (six) hours as needed.      . traZODone (DESYREL) 50 MG tablet TAKE 1 TABLET BY MOUTH AT BEDTIME 30 tablet 11  . vitamin C (ASCORBIC ACID) 500 MG tablet Take 500  mg by mouth daily.    Marland Kitchen zinc gluconate 50 MG tablet Take 50 mg by mouth daily.     No facility-administered medications prior to visit.     PHYSICAL EXAM  Filed Vitals:   09/19/14 1311  BP: 140/81  Pulse: 82  Height:  (1.6 m)  Weight: 147 lb 3.2 oz (66.769 kg)   Body mass index is 26.08 kg/(m^2). No exam data present  Neurologic Exam  Mental Status: Awake, alert. Language is fluent and comprehension intact. NEG MYERSONS. NEG SNOUT. MASKED FACIES. Cranial Nerves: Pupils are equal and reactive to light. Visual fields are full to confrontation. Conjugate eye movements are full and symmetric; EXCEPT FOR RIGHT EXO/HYPERTROPIA. Facial sensation and strength are symmetric. Hearing is intact. Palate elevated symmetrically and uvula is midline. Shoulder shrug is symmetric. Tongue is midline.  Motor: Normal bulk. MILD ACTION/POSTURAL. NO REST TREMOR. BRADYKINESIA IN LUE>RUE. COGWHEELING IN LUE. Full strength in the upper and lower extremities. No pronator drift.  Sensory: Intact and symmetric  to light touch and temperature  Coordination: No ataxia or dysmetria on finger-nose or rapid alternating movement testing.  Gait and Station: STOOPED POSTURE. DECR ARM SWING.  Reflexes: Deep tendon reflexes in the upper and lower extremity are present and symmetric; TRACE AT ANKLES.    DIAGNOSTIC DATA (LABS, IMAGING, TESTING) - I reviewed patient records, labs, notes, testing and imaging myself where available.  Lab Results  Component Value Date   HGBA1C 6.4 09/12/2014   Lab Results  Component Value Date   VITAMINB12 546 10/21/2011   Lab Results  Component Value Date   TSH 0.87 09/12/2014    11/19/11 MRI of brain - multiple round and ovoid, periventricular, subcortical, thalamic and pontine foci of T2 hyperintensity likely representative of chronic small vessel ischemic disease.    ASSESSMENT AND PLAN  69 y.o. 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 She has 4 of the 4 cardinal features for Parkinson's disease. On carb/levo 25/100 (2 tabs TID).  Dx: idiopathic parkinson's disease + syncope event (07/08/14)  PLAN:  I spent 15 minutes of face to face time with patient. Greater than 50% of time was spent in counseling and coordination of care with patient. In summary we discussed:  1. Continue carbidopa-levodopa 2 tabs TID 2. Monitor BP for hypotension 3. Use cane or walker for fall risk reduction 4. Follow up with PCP re: syncope  Return in about 6 months (around 03/21/2015).    Suanne Marker, MD 09/19/2014, 1:36 PM Certified in Neurology, Neurophysiology and Neuroimaging  [facility] [address], [address] [place], [place] [address] [phone]

## 2014-11-02 ENCOUNTER — Other Ambulatory Visit: Payer: Self-pay | Admitting: Internal Medicine

## 2014-11-03 ENCOUNTER — Telehealth: Payer: Self-pay | Admitting: *Deleted

## 2014-11-03 MED ORDER — GLUCOSE BLOOD VI STRP: 1.0000 | ORAL_STRIP | Freq: Every day | Status: AC

## 2014-11-03 NOTE — Telephone Encounter (Signed)
Pt states she is needing refill on her testing "one [facility]". Verified pharmacy inform will send to [facility]...Raechel Chute

## 2014-11-21 ENCOUNTER — Telehealth: Payer: Self-pay | Admitting: Diagnostic Neuroimaging

## 2014-11-21 DIAGNOSIS — F329 Major depressive disorder, single episode, unspecified: Secondary | ICD-10-CM

## 2014-11-21 DIAGNOSIS — F32A Depression, unspecified: Secondary | ICD-10-CM

## 2014-11-21 NOTE — Telephone Encounter (Signed)
Patient is calling as she is having a hard time with depression and is wondering if she should see a counselor or if her medication for depression should be changed. Can Dr. Marjory Lies give her direction?  Please call!

## 2014-11-21 NOTE — Telephone Encounter (Signed)
Called patient advised would put in a referral for psychiatry per Dr. Marjory Lies. Patient agreed.

## 2014-11-23 NOTE — Telephone Encounter (Signed)
Spoke to patient. Advised referral was sent to [facility] Ph. [phone] Fax [phone]. Patient verbalized understanding.

## 2015-01-11 ENCOUNTER — Ambulatory Visit: Payer: Medicare Other | Admitting: Internal Medicine

## 2015-01-25 NOTE — Telephone Encounter (Signed)
Error

## 2015-01-26 ENCOUNTER — Telehealth: Payer: Self-pay | Admitting: Internal Medicine

## 2015-01-26 NOTE — Telephone Encounter (Signed)
Do not see on current or history md list. Ok for verbal?

## 2015-01-26 NOTE — Telephone Encounter (Signed)
This is the first note on this matter will address

## 2015-01-26 NOTE — Telephone Encounter (Signed)
Barbara Bradshaw [facility] request verbal order for Lidocaine 5% ointment, they are trying to get this done for multiple times now and never heard anything from our office. Please call them back

## 2015-01-27 NOTE — Telephone Encounter (Signed)
yes

## 2015-02-11 ENCOUNTER — Other Ambulatory Visit: Payer: Self-pay | Admitting: Internal Medicine

## 2015-02-13 ENCOUNTER — Ambulatory Visit (INDEPENDENT_AMBULATORY_CARE_PROVIDER_SITE_OTHER): Payer: Medicare Other | Admitting: Internal Medicine

## 2015-02-13 ENCOUNTER — Encounter: Payer: Self-pay | Admitting: Internal Medicine

## 2015-02-13 ENCOUNTER — Other Ambulatory Visit (INDEPENDENT_AMBULATORY_CARE_PROVIDER_SITE_OTHER): Payer: Medicare Other

## 2015-02-13 ENCOUNTER — Encounter: Payer: Medicare Other | Admitting: Internal Medicine

## 2015-02-13 VITALS — BP 130/70 | HR 77 | Temp 98.1°F | Resp 16 | Ht 63.0 in

## 2015-02-13 DIAGNOSIS — Z1231 Encounter for screening mammogram for malignant neoplasm of breast: Secondary | ICD-10-CM | POA: Insufficient documentation

## 2015-02-13 DIAGNOSIS — I10 Essential (primary) hypertension: Secondary | ICD-10-CM

## 2015-02-13 DIAGNOSIS — E118 Type 2 diabetes mellitus with unspecified complications: Secondary | ICD-10-CM

## 2015-02-13 DIAGNOSIS — E785 Hyperlipidemia, unspecified: Secondary | ICD-10-CM | POA: Diagnosis not present

## 2015-02-13 DIAGNOSIS — Z0000 Encounter for general adult medical examination without abnormal findings: Secondary | ICD-10-CM

## 2015-02-13 LAB — CBC WITH DIFFERENTIAL/PLATELET
BASOS PCT: 0.5 % (ref 0.0–3.0)
Basophils Absolute: 0 10*3/uL (ref 0.0–0.1)
EOS PCT: 4.1 % (ref 0.0–5.0)
Eosinophils Absolute: 0.4 10*3/uL (ref 0.0–0.7)
HEMATOCRIT: 41.6 % (ref 36.0–46.0)
HEMOGLOBIN: 13.7 g/dL (ref 12.0–15.0)
LYMPHS PCT: 26 % (ref 12.0–46.0)
Lymphs Abs: 2.3 10*3/uL (ref 0.7–4.0)
MCHC: 32.9 g/dL (ref 30.0–36.0)
MCV: 88 fl (ref 78.0–100.0)
MONO ABS: 0.8 10*3/uL (ref 0.1–1.0)
MONOS PCT: 8.6 % (ref 3.0–12.0)
Neutro Abs: 5.3 10*3/uL (ref 1.4–7.7)
Neutrophils Relative %: 60.8 % (ref 43.0–77.0)
Platelets: 164 10*3/uL (ref 150.0–400.0)
RBC: 4.73 Mil/uL (ref 3.87–5.11)
RDW: 12.8 % (ref 11.5–15.5)
WBC: 8.7 10*3/uL (ref 4.0–10.5)

## 2015-02-13 LAB — URINALYSIS, ROUTINE W REFLEX MICROSCOPIC
Bilirubin Urine: NEGATIVE
Hgb urine dipstick: NEGATIVE
Nitrite: NEGATIVE
RBC / HPF: NONE SEEN
Specific Gravity, Urine: 1.02
Total Protein, Urine: NEGATIVE
Urine Glucose: NEGATIVE
Urobilinogen, UA: 0.2
pH: 6 (ref 5.0–8.0)

## 2015-02-13 LAB — LIPID PANEL
CHOLESTEROL: 101 mg/dL (ref 0–200)
HDL: 51.4 mg/dL (ref >39.00)
LDL CALC: 35 mg/dL (ref 0–99)
NonHDL: 49.47
Total CHOL/HDL Ratio: 2
Triglycerides: 71 mg/dL (ref 0.0–149.0)
VLDL: 14.2 mg/dL (ref 0.0–40.0)

## 2015-02-13 LAB — COMPREHENSIVE METABOLIC PANEL
ALBUMIN: 4.3 g/dL (ref 3.5–5.2)
ALK PHOS: 81 U/L (ref 39–117)
ALT: 3 U/L (ref 0–35)
AST: 16 U/L (ref 0–37)
BUN: 17 mg/dL (ref 6–23)
CALCIUM: 10.8 mg/dL — AB (ref 8.4–10.5)
CHLORIDE: 101 meq/L (ref 96–112)
CO2: 30 mEq/L (ref 19–32)
Creatinine, Ser: 1.14 mg/dL (ref 0.40–1.20)
GFR: 50.07 mL/min — AB (ref >60.00)
Glucose, Bld: 140 mg/dL — ABNORMAL HIGH (ref 70–99)
POTASSIUM: 4.5 meq/L (ref 3.5–5.1)
SODIUM: 140 meq/L (ref 135–145)
TOTAL PROTEIN: 7.1 g/dL (ref 6.0–8.3)
Total Bilirubin: 0.6 mg/dL (ref 0.2–1.2)

## 2015-02-13 LAB — MICROALBUMIN / CREATININE URINE RATIO
Creatinine,U: 194.6 mg/dL
MICROALB/CREAT RATIO: 3.4 mg/g (ref 0.0–30.0)
Microalb, Ur: 6.7 mg/dL — ABNORMAL HIGH (ref 0.0–1.9)

## 2015-02-13 LAB — HEMOGLOBIN A1C: HEMOGLOBIN A1C: 6.3 % (ref 4.6–6.5)

## 2015-02-13 NOTE — Patient Instructions (Signed)
Preventive Care for Adults, Female A healthy lifestyle and preventive care can promote health and wellness. Preventive health guidelines for women include the following key practices.  A routine yearly physical is a good way to check with your health care provider about your health and preventive screening. It is a chance to share any concerns and updates on your health and to receive a thorough exam.  Visit your dentist for a routine exam and preventive care every 6 months. Brush your teeth twice a day and floss once a day. Good oral hygiene prevents tooth decay and gum disease.  The frequency of eye exams is based on your age, health, family medical history, use of contact lenses, and other factors. Follow your health care provider's recommendations for frequency of eye exams.  Eat a healthy diet. Foods like vegetables, fruits, whole grains, low-fat dairy products, and lean protein foods contain the nutrients you need without too many calories. Decrease your intake of foods high in solid fats, added sugars, and salt. Eat the right amount of calories for you.Get information about a proper diet from your health care provider, if necessary.  Regular physical exercise is one of the most important things you can do for your health. Most adults should get at least 150 minutes of moderate-intensity exercise (any activity that increases your heart rate and causes you to sweat) each week. In addition, most adults need muscle-strengthening exercises on 2 or more days a week.  Maintain a healthy weight. The body mass index (BMI) is a screening tool to identify possible weight problems. It provides an estimate of body fat based on height and weight. Your health care provider can find your BMI and can help you achieve or maintain a healthy weight.For adults 20 years and older:  A BMI below 18.5 is considered underweight.  A BMI of 18.5 to 24.9 is normal.  A BMI of 25 to 29.9 is considered overweight.  A  BMI of 30 and above is considered obese.  Maintain normal blood lipids and cholesterol levels by exercising and minimizing your intake of saturated fat. Eat a balanced diet with plenty of fruit and vegetables. Blood tests for lipids and cholesterol should begin at age 45 and be repeated every 5 years. If your lipid or cholesterol levels are high, you are over 50, or you are at high risk for heart disease, you may need your cholesterol levels checked more frequently.Ongoing high lipid and cholesterol levels should be treated with medicines if diet and exercise are not working.  If you smoke, find out from your health care provider how to quit. If you do not use tobacco, do not start.  Lung cancer screening is recommended for adults aged 45-80 years who are at high risk for developing lung cancer because of a history of smoking. A yearly low-dose CT scan of the lungs is recommended for people who have at least a 30-pack-year history of smoking and are a current smoker or have quit within the past 15 years. A pack year of smoking is smoking an average of 1 pack of cigarettes a day for 1 year (for example: 1 pack a day for 30 years or 2 packs a day for 15 years). Yearly screening should continue until the smoker has stopped smoking for at least 15 years. Yearly screening should be stopped for people who develop a health problem that would prevent them from having lung cancer treatment.  If you are pregnant, do not drink alcohol. If you are  breastfeeding, be very cautious about drinking alcohol. If you are not pregnant and choose to drink alcohol, do not have more than 1 drink per day. One drink is considered to be 12 ounces (355 mL) of beer, 5 ounces (148 mL) of wine, or 1.5 ounces (44 mL) of liquor.  Avoid use of street drugs. Do not share needles with anyone. Ask for help if you need support or instructions about stopping the use of drugs.  High blood pressure causes heart disease and increases the risk  of stroke. Your blood pressure should be checked at least every 1 to 2 years. Ongoing high blood pressure should be treated with medicines if weight loss and exercise do not work.  If you are 55-79 years old, ask your health care provider if you should take aspirin to prevent strokes.  Diabetes screening is done by taking a blood sample to check your blood glucose level after you have not eaten for a certain period of time (fasting). If you are not overweight and you do not have risk factors for diabetes, you should be screened once every 3 years starting at age 45. If you are overweight or obese and you are 40-70 years of age, you should be screened for diabetes every year as part of your cardiovascular risk assessment.  Breast cancer screening is essential preventive care for women. You should practice "breast self-awareness." This means understanding the normal appearance and feel of your breasts and may include breast self-examination. Any changes detected, no matter how small, should be reported to a health care provider. Women in their 20s and 30s should have a clinical breast exam (CBE) by a health care provider as part of a regular health exam every 1 to 3 years. After age 40, women should have a CBE every year. Starting at age 40, women should consider having a mammogram (breast X-ray test) every year. Women who have a family history of breast cancer should talk to their health care provider about genetic screening. Women at a high risk of breast cancer should talk to their health care providers about having an MRI and a mammogram every year.  Breast cancer gene (BRCA)-related cancer risk assessment is recommended for women who have family members with BRCA-related cancers. BRCA-related cancers include breast, ovarian, tubal, and peritoneal cancers. Having family members with these cancers may be associated with an increased risk for harmful changes (mutations) in the breast cancer genes BRCA1 and  BRCA2. Results of the assessment will determine the need for genetic counseling and BRCA1 and BRCA2 testing.  Your health care provider may recommend that you be screened regularly for cancer of the pelvic organs (ovaries, uterus, and vagina). This screening involves a pelvic examination, including checking for microscopic changes to the surface of your cervix (Pap test). You may be encouraged to have this screening done every 3 years, beginning at age 21.  For women ages 30-65, health care providers may recommend pelvic exams and Pap testing every 3 years, or they may recommend the Pap and pelvic exam, combined with testing for human papilloma virus (HPV), every 5 years. Some types of HPV increase your risk of cervical cancer. Testing for HPV may also be done on women of any age with unclear Pap test results.  Other health care providers may not recommend any screening for nonpregnant women who are considered low risk for pelvic cancer and who do not have symptoms. Ask your health care provider if a screening pelvic exam is right for   you.  If you have had past treatment for cervical cancer or a condition that could lead to cancer, you need Pap tests and screening for cancer for at least 20 years after your treatment. If Pap tests have been discontinued, your risk factors (such as having a new sexual partner) need to be reassessed to determine if screening should resume. Some women have medical problems that increase the chance of getting cervical cancer. In these cases, your health care provider may recommend more frequent screening and Pap tests.  Colorectal cancer can be detected and often prevented. Most routine colorectal cancer screening begins at the age of 63 years and continues through age 36 years. However, your health care provider may recommend screening at an earlier age if you have risk factors for colon cancer. On a yearly basis, your health care provider may provide home test kits to check  for hidden blood in the stool. Use of a small camera at the end of a tube, to directly examine the colon (sigmoidoscopy or colonoscopy), can detect the earliest forms of colorectal cancer. Talk to your health care provider about this at age 65, when routine screening begins. Direct exam of the colon should be repeated every 5-10 years through age 2 years, unless early forms of precancerous polyps or small growths are found.  People who are at an increased risk for hepatitis B should be screened for this virus. You are considered at high risk for hepatitis B if:  You were born in a country where hepatitis B occurs often. Talk with your health care provider about which countries are considered high risk.  Your parents were born in a high-risk country and you have not received a shot to protect against hepatitis B (hepatitis B vaccine).  You have HIV or AIDS.  You use needles to inject street drugs.  You live with, or have sex with, someone who has hepatitis B.  You get hemodialysis treatment.  You take certain medicines for conditions like cancer, organ transplantation, and autoimmune conditions.  Hepatitis C blood testing is recommended for all people born from [birth year] through 1965 and any individual with known risks for hepatitis C.  Practice safe sex. Use condoms and avoid high-risk sexual practices to reduce the spread of sexually transmitted infections (STIs). STIs include gonorrhea, chlamydia, syphilis, trichomonas, herpes, HPV, and human immunodeficiency virus (HIV). Herpes, HIV, and HPV are viral illnesses that have no cure. They can result in disability, cancer, and death.  You should be screened for sexually transmitted illnesses (STIs) including gonorrhea and chlamydia if:  You are sexually active and are younger than 24 years.  You are older than 24 years and your health care provider tells you that you are at risk for this type of infection.  Your sexual activity has changed  since you were last screened and you are at an increased risk for chlamydia or gonorrhea. Ask your health care provider if you are at risk.  If you are at risk of being infected with HIV, it is recommended that you take a prescription medicine daily to prevent HIV infection. This is called preexposure prophylaxis (PrEP). You are considered at risk if:  You are sexually active and do not regularly use condoms or know the HIV status of your partner(s).  You take drugs by injection.  You are sexually active with a partner who has HIV.  Talk with your health care provider about whether you are at high risk of being infected with HIV. If  you choose to begin PrEP, you should first be tested for HIV. You should then be tested every 3 months for as long as you are taking PrEP.  Osteoporosis is a disease in which the bones lose minerals and strength with aging. This can result in serious bone fractures or breaks. The risk of osteoporosis can be identified using a bone density scan. Women ages 35 years and over and women at risk for fractures or osteoporosis should discuss screening with their health care providers. Ask your health care provider whether you should take a calcium supplement or vitamin D to reduce the rate of osteoporosis.  Menopause can be associated with physical symptoms and risks. Hormone replacement therapy is available to decrease symptoms and risks. You should talk to your health care provider about whether hormone replacement therapy is right for you.  Use sunscreen. Apply sunscreen liberally and repeatedly throughout the day. You should seek shade when your shadow is shorter than you. Protect yourself by wearing long sleeves, pants, a wide-brimmed hat, and sunglasses year round, whenever you are outdoors.  Once a month, do a whole body skin exam, using a mirror to look at the skin on your back. Tell your health care provider of new moles, moles that have irregular borders, moles that  are larger than a pencil eraser, or moles that have changed in shape or color.  Stay current with required vaccines (immunizations).  Influenza vaccine. All adults should be immunized every year.  Tetanus, diphtheria, and acellular pertussis (Td, Tdap) vaccine. Pregnant women should receive 1 dose of Tdap vaccine during each pregnancy. The dose should be obtained regardless of the length of time since the last dose. Immunization is preferred during the 27th-36th week of gestation. An adult who has not previously received Tdap or who does not know her vaccine status should receive 1 dose of Tdap. This initial dose should be followed by tetanus and diphtheria toxoids (Td) booster doses every 10 years. Adults with an unknown or incomplete history of completing a 3-dose immunization series with Td-containing vaccines should begin or complete a primary immunization series including a Tdap dose. Adults should receive a Td booster every 10 years.  Varicella vaccine. An adult without evidence of immunity to varicella should receive 2 doses or a second dose if she has previously received 1 dose. Pregnant females who do not have evidence of immunity should receive the first dose after pregnancy. This first dose should be obtained before leaving the health care facility. The second dose should be obtained 4-8 weeks after the first dose.  Human papillomavirus (HPV) vaccine. Females aged 13-26 years who have not received the vaccine previously should obtain the 3-dose series. The vaccine is not recommended for use in pregnant females. However, pregnancy testing is not needed before receiving a dose. If a female is found to be pregnant after receiving a dose, no treatment is needed. In that case, the remaining doses should be delayed until after the pregnancy. Immunization is recommended for any person with an immunocompromised condition through the age of 67 years if she did not get any or all doses earlier. During the  3-dose series, the second dose should be obtained 4-8 weeks after the first dose. The third dose should be obtained 24 weeks after the first dose and 16 weeks after the second dose.  Zoster vaccine. One dose is recommended for adults aged 78 years or older unless certain conditions are present.  Measles, mumps, and rubella (MMR) vaccine. Adults born  before 1957 generally are considered immune to measles and mumps. Adults born in [birth year] or later should have 1 or more doses of MMR vaccine unless there is a contraindication to the vaccine or there is laboratory evidence of immunity to each of the three diseases. A routine second dose of MMR vaccine should be obtained at least 28 days after the first dose for students attending postsecondary schools, health care workers, or international travelers. People who received inactivated measles vaccine or an unknown type of measles vaccine during 1963-1967 should receive 2 doses of MMR vaccine. People who received inactivated mumps vaccine or an unknown type of mumps vaccine before 1979 and are at high risk for mumps infection should consider immunization with 2 doses of MMR vaccine. For females of childbearing age, rubella immunity should be determined. If there is no evidence of immunity, females who are not pregnant should be vaccinated. If there is no evidence of immunity, females who are pregnant should delay immunization until after pregnancy. Unvaccinated health care workers born before [birth year] who lack laboratory evidence of measles, mumps, or rubella immunity or laboratory confirmation of disease should consider measles and mumps immunization with 2 doses of MMR vaccine or rubella immunization with 1 dose of MMR vaccine.  Pneumococcal 13-valent conjugate (PCV13) vaccine. When indicated, a person who is uncertain of his immunization history and has no record of immunization should receive the PCV13 vaccine. All adults 86 years of age and older should receive this  vaccine. An adult aged 48 years or older who has certain medical conditions and has not been previously immunized should receive 1 dose of PCV13 vaccine. This PCV13 should be followed with a dose of pneumococcal polysaccharide (PPSV23) vaccine. Adults who are at high risk for pneumococcal disease should obtain the PPSV23 vaccine at least 8 weeks after the dose of PCV13 vaccine. Adults older than 70 years of age who have normal immune system function should obtain the PPSV23 vaccine dose at least 1 year after the dose of PCV13 vaccine.  Pneumococcal polysaccharide (PPSV23) vaccine. When PCV13 is also indicated, PCV13 should be obtained first. All adults aged 72 years and older should be immunized. An adult younger than age 8 years who has certain medical conditions should be immunized. Any person who resides in a nursing home or long-term care facility should be immunized. An adult smoker should be immunized. People with an immunocompromised condition and certain other conditions should receive both PCV13 and PPSV23 vaccines. People with human immunodeficiency virus (HIV) infection should be immunized as soon as possible after diagnosis. Immunization during chemotherapy or radiation therapy should be avoided. Routine use of PPSV23 vaccine is not recommended for American Indians, [place]atives, or people younger than 65 years unless there are medical conditions that require PPSV23 vaccine. When indicated, people who have unknown immunization and have no record of immunization should receive PPSV23 vaccine. One-time revaccination 5 years after the first dose of PPSV23 is recommended for people aged 19-64 years who have chronic kidney failure, nephrotic syndrome, asplenia, or immunocompromised conditions. People who received 1-2 doses of PPSV23 before age 24 years should receive another dose of PPSV23 vaccine at age 62 years or later if at least 5 years have passed since the previous dose. Doses of PPSV23 are not  needed for people immunized with PPSV23 at or after age 87 years.  Meningococcal vaccine. Adults with asplenia or persistent complement component deficiencies should receive 2 doses of quadrivalent meningococcal conjugate (MenACWY-D) vaccine. The doses should be obtained  at least 2 months apart. Microbiologists working with certain meningococcal bacteria, [place] recruits, people at risk during an outbreak, and people who travel to or live in countries with a high rate of meningitis should be immunized. A first-year college student up through age 34 years who is living in a residence hall should receive a dose if she did not receive a dose on or after her 16th birthday. Adults who have certain high-risk conditions should receive one or more doses of vaccine.  Hepatitis A vaccine. Adults who wish to be protected from this disease, have certain high-risk conditions, work with hepatitis A-infected animals, work in hepatitis A research labs, or travel to or work in countries with a high rate of hepatitis A should be immunized. Adults who were previously unvaccinated and who anticipate close contact with an international adoptee during the first 60 days after arrival in the [place] from a country with a high rate of hepatitis A should be immunized.  Hepatitis B vaccine. Adults who wish to be protected from this disease, have certain high-risk conditions, may be exposed to blood or other infectious body fluids, are household contacts or sex partners of hepatitis B positive people, are clients or workers in certain care facilities, or travel to or work in countries with a high rate of hepatitis B should be immunized.  Haemophilus influenzae type b (Hib) vaccine. A previously unvaccinated person with asplenia or sickle cell disease or having a scheduled splenectomy should receive 1 dose of Hib vaccine. Regardless of previous immunization, a recipient of a hematopoietic stem cell transplant should receive a  3-dose series 6-12 months after her successful transplant. Hib vaccine is not recommended for adults with HIV infection. Preventive Services / Frequency Ages 35 to 4 years  Blood pressure check.** / Every 3-5 years.  Lipid and cholesterol check.** / Every 5 years beginning at age 60.  Clinical breast exam.** / Every 3 years for women in their 71s and 10s.  BRCA-related cancer risk assessment.** / For women who have family members with a BRCA-related cancer (breast, ovarian, tubal, or peritoneal cancers).  Pap test.** / Every 2 years from ages 76 through 26. Every 3 years starting at age 61 through age 76 or 93 with a history of 3 consecutive normal Pap tests.  HPV screening.** / Every 3 years from ages 37 through ages 60 to 51 with a history of 3 consecutive normal Pap tests.  Hepatitis C blood test.** / For any individual with known risks for hepatitis C.  Skin self-exam. / Monthly.  Influenza vaccine. / Every year.  Tetanus, diphtheria, and acellular pertussis (Tdap, Td) vaccine.** / Consult your health care provider. Pregnant women should receive 1 dose of Tdap vaccine during each pregnancy. 1 dose of Td every 10 years.  Varicella vaccine.** / Consult your health care provider. Pregnant females who do not have evidence of immunity should receive the first dose after pregnancy.  HPV vaccine. / 3 doses over 6 months, if 93 and younger. The vaccine is not recommended for use in pregnant females. However, pregnancy testing is not needed before receiving a dose.  Measles, mumps, rubella (MMR) vaccine.** / You need at least 1 dose of MMR if you were born in 1957 or later. You may also need a 2nd dose. For females of childbearing age, rubella immunity should be determined. If there is no evidence of immunity, females who are not pregnant should be vaccinated. If there is no evidence of immunity, females who are  pregnant should delay immunization until after pregnancy.  Pneumococcal  13-valent conjugate (PCV13) vaccine.** / Consult your health care provider.  Pneumococcal polysaccharide (PPSV23) vaccine.** / 1 to 2 doses if you smoke cigarettes or if you have certain conditions.  Meningococcal vaccine.** / 1 dose if you are age 25 to 72 years and a Market researcher living in a residence hall, or have one of several medical conditions, you need to get vaccinated against meningococcal disease. You may also need additional booster doses.  Hepatitis A vaccine.** / Consult your health care provider.  Hepatitis B vaccine.** / Consult your health care provider.  Haemophilus influenzae type b (Hib) vaccine.** / Consult your health care provider. Ages 76 to 51 years  Blood pressure check.** / Every year.  Lipid and cholesterol check.** / Every 5 years beginning at age 93 years.  Lung cancer screening. / Every year if you are aged 82-80 years and have a 30-pack-year history of smoking and currently smoke or have quit within the past 15 years. Yearly screening is stopped once you have quit smoking for at least 15 years or develop a health problem that would prevent you from having lung cancer treatment.  Clinical breast exam.** / Every year after age 25 years.  BRCA-related cancer risk assessment.** / For women who have family members with a BRCA-related cancer (breast, ovarian, tubal, or peritoneal cancers).  Mammogram.** / Every year beginning at age 66 years and continuing for as long as you are in good health. Consult with your health care provider.  Pap test.** / Every 3 years starting at age 82 years through age 56 or 36 years with a history of 3 consecutive normal Pap tests.  HPV screening.** / Every 3 years from ages 63 years through ages 54 to 76 years with a history of 3 consecutive normal Pap tests.  Fecal occult blood test (FOBT) of stool. / Every year beginning at age 27 years and continuing until age 76 years. You may not need to do this test if you get  a colonoscopy every 10 years.  Flexible sigmoidoscopy or colonoscopy.** / Every 5 years for a flexible sigmoidoscopy or every 10 years for a colonoscopy beginning at age 4 years and continuing until age 26 years.  Hepatitis C blood test.** / For all people born from [birth year] through 1966 and any individual with known risks for hepatitis C.  Skin self-exam. / Monthly.  Influenza vaccine. / Every year.  Tetanus, diphtheria, and acellular pertussis (Tdap/Td) vaccine.** / Consult your health care provider. Pregnant women should receive 1 dose of Tdap vaccine during each pregnancy. 1 dose of Td every 10 years.  Varicella vaccine.** / Consult your health care provider. Pregnant females who do not have evidence of immunity should receive the first dose after pregnancy.  Zoster vaccine.** / 1 dose for adults aged 51 years or older.  Measles, mumps, rubella (MMR) vaccine.** / You need at least 1 dose of MMR if you were born in 1957 or later. You may also need a second dose. For females of childbearing age, rubella immunity should be determined. If there is no evidence of immunity, females who are not pregnant should be vaccinated. If there is no evidence of immunity, females who are pregnant should delay immunization until after pregnancy.  Pneumococcal 13-valent conjugate (PCV13) vaccine.** / Consult your health care provider.  Pneumococcal polysaccharide (PPSV23) vaccine.** / 1 to 2 doses if you smoke cigarettes or if you have certain conditions.  Meningococcal vaccine.** /  Consult your health care provider.  Hepatitis A vaccine.** / Consult your health care provider.  Hepatitis B vaccine.** / Consult your health care provider.  Haemophilus influenzae type b (Hib) vaccine.** / Consult your health care provider. Ages 58 years and over  Blood pressure check.** / Every year.  Lipid and cholesterol check.** / Every 5 years beginning at age 11 years.  Lung cancer screening. / Every year if you  are aged 9-80 years and have a 30-pack-year history of smoking and currently smoke or have quit within the past 15 years. Yearly screening is stopped once you have quit smoking for at least 15 years or develop a health problem that would prevent you from having lung cancer treatment.  Clinical breast exam.** / Every year after age 71 years.  BRCA-related cancer risk assessment.** / For women who have family members with a BRCA-related cancer (breast, ovarian, tubal, or peritoneal cancers).  Mammogram.** / Every year beginning at age 30 years and continuing for as long as you are in good health. Consult with your health care provider.  Pap test.** / Every 3 years starting at age 78 years through age 64 or 41 years with 3 consecutive normal Pap tests. Testing can be stopped between 65 and 70 years with 3 consecutive normal Pap tests and no abnormal Pap or HPV tests in the past 10 years.  HPV screening.** / Every 3 years from ages 63 years through ages 70 or 36 years with a history of 3 consecutive normal Pap tests. Testing can be stopped between 65 and 70 years with 3 consecutive normal Pap tests and no abnormal Pap or HPV tests in the past 10 years.  Fecal occult blood test (FOBT) of stool. / Every year beginning at age 62 years and continuing until age 103 years. You may not need to do this test if you get a colonoscopy every 10 years.  Flexible sigmoidoscopy or colonoscopy.** / Every 5 years for a flexible sigmoidoscopy or every 10 years for a colonoscopy beginning at age 63 years and continuing until age 57 years.  Hepatitis C blood test.** / For all people born from [birth year] through 1966 and any individual with known risks for hepatitis C.  Osteoporosis screening.** / A one-time screening for women ages 60 years and over and women at risk for fractures or osteoporosis.  Skin self-exam. / Monthly.  Influenza vaccine. / Every year.  Tetanus, diphtheria, and acellular pertussis (Tdap/Td)  vaccine.** / 1 dose of Td every 10 years.  Varicella vaccine.** / Consult your health care provider.  Zoster vaccine.** / 1 dose for adults aged 44 years or older.  Pneumococcal 13-valent conjugate (PCV13) vaccine.** / Consult your health care provider.  Pneumococcal polysaccharide (PPSV23) vaccine.** / 1 dose for all adults aged 60 years and older.  Meningococcal vaccine.** / Consult your health care provider.  Hepatitis A vaccine.** / Consult your health care provider.  Hepatitis B vaccine.** / Consult your health care provider.  Haemophilus influenzae type b (Hib) vaccine.** / Consult your health care provider. ** Family history and personal history of risk and conditions may change your health care provider's recommendations.   This information is not intended to replace advice given to you by your health care provider. Make sure you discuss any questions you have with your health care provider.   Document Released: 05/13/2001 Document Revised: 04/07/2014 Document Reviewed: 08/12/2010 Elsevier Interactive Patient Education [employer].

## 2015-02-13 NOTE — Progress Notes (Signed)
Pre visit review using our clinic review tool, if applicable. No additional management support is needed unless otherwise documented below in the visit note. 

## 2015-02-13 NOTE — Progress Notes (Signed)
Subjective:  Patient ID: Barbara Bradshaw, female    DOB: [birth year]  Age: 69 y.o. MRN: [id number]  CC: Annual Exam and Diabetes   HPI Barbara SpikeBrenda M Bradshaw presents for a complete physical and follow-up on diabetes. She tells me she has stopped taking Linzess and that her constipation has resolved. She offers no other new complaints.  Outpatient Prescriptions Prior to Visit  Medication Sig Dispense Refill  . ADACEL 07-30-13.5 LF-MCG/0.5 injection     . aspirin 81 MG tablet Take 81 mg by mouth daily.      Marland Kitchen. atorvastatin (LIPITOR) 40 MG tablet TAKE 1 TABLET BY MOUTH DAILY 90 tablet 3  . b complex vitamins capsule Take 1 capsule by mouth daily.    Marland Kitchen. buPROPion (WELLBUTRIN XL) 150 MG 24 hr tablet TAKE 1 TABLET BY MOUTH EVERY DAY 30 tablet 11  . calcium carbonate (OS-CAL) 600 MG TABS Take 600 mg by mouth daily.      . carbidopa-levodopa (SINEMET IR) 25-100 MG per tablet Take 2 tablets by mouth 3 (three) times daily. 540 tablet 4  . cholecalciferol (VITAMIN D) 1000 UNITS tablet Take 1,000 Units by mouth daily.    . fish oil-omega-3 fatty acids 1000 MG capsule Take 1 g by mouth daily.    . fluticasone (FLONASE) 50 MCG/ACT nasal spray Place 2 sprays into both nostrils daily. 16 g 11  . furosemide (LASIX) 40 MG tablet TAKE 1 TABLET BY MOUTH EVERY DAY 90 tablet 0  . glucose blood (ONE TOUCH ULTRA TEST) test strip 1 each by Other route daily. Use to check blood sugars daily E11.8 100 each 11  . ipratropium (ATROVENT) 0.03 % nasal spray Place 2 sprays into both nostrils 2 (two) times daily. 30 mL 0  . Lancets (ONETOUCH ULTRASOFT) lancets Use as instructed 100 each 12  . Multiple Vitamin (MULTIVITAMIN) tablet Take 1 tablet by mouth daily.    . quinapril (ACCUPRIL) 10 MG tablet TAKE 1 TABLET BY MOUTH DAILY 90 tablet 3  . simethicone (MYLICON) 125 MG chewable tablet Chew 125 mg by mouth every 6 (six) hours as needed.      . traZODone (DESYREL) 50 MG tablet TAKE 1 TABLET BY MOUTH AT BEDTIME 30 tablet 11  .  vitamin C (ASCORBIC ACID) 500 MG tablet Take 500 mg by mouth daily.    Marland Kitchen. zinc gluconate 50 MG tablet Take 50 mg by mouth daily.    . Linaclotide (LINZESS) 145 MCG CAPS capsule Take 1 capsule (145 mcg total) by mouth daily. 30 capsule 11  . metFORMIN (GLUCOPHAGE) 1000 MG tablet TAKE 1 TABLET BY MOUTH TWICE DAILY (Patient not taking: Reported on 02/13/2015) 180 tablet 3   No facility-administered medications prior to visit.    ROS Review of Systems  Constitutional: Negative.  Negative for fever, chills, diaphoresis, appetite change and fatigue.  HENT: Negative.   Eyes: Negative.   Respiratory: Negative.  Negative for cough, choking, chest tightness, shortness of breath and stridor.   Cardiovascular: Negative.  Negative for chest pain, palpitations and leg swelling.  Gastrointestinal: Negative.  Negative for nausea, vomiting, abdominal pain, diarrhea, constipation and blood in stool.  Endocrine: Negative.  Negative for polydipsia, polyphagia and polyuria.  Genitourinary: Negative.   Musculoskeletal: Negative.  Negative for myalgias and back pain.  Skin: Negative.  Negative for rash.  Allergic/Immunologic: Negative.   Neurological: Positive for tremors. Negative for dizziness, syncope, weakness, light-headedness and numbness.  Hematological: Negative.  Negative for adenopathy. Does not bruise/bleed easily.  Psychiatric/Behavioral:  Negative.     Objective:  BP 130/70 mmHg  Pulse 77  Temp(Src) 98.1 F (36.7 C) (Oral)  Ht  (1.6 m)  SpO2 95%  BP Readings from Last 3 Encounters:  02/13/15 130/70  09/19/14 140/81  09/12/14 124/74    Wt Readings from Last 3 Encounters:  09/19/14 147 lb 3.2 oz (66.769 kg)  09/12/14 148 lb 12 oz (67.473 kg)  07/12/14 148 lb 6.4 oz (67.314 kg)    Physical Exam  Constitutional: She is oriented to person, place, and time. No distress.  HENT:  Head: Normocephalic and atraumatic.  Mouth/Throat: Oropharynx is clear and moist. No oropharyngeal  exudate.  Eyes: Conjunctivae are normal. Right eye exhibits no discharge. Left eye exhibits no discharge. No scleral icterus.  Neck: Normal range of motion. Neck supple. No JVD present. No tracheal deviation present. No thyromegaly present.  Cardiovascular: Normal rate, regular rhythm, normal heart sounds and intact distal pulses.  Exam reveals no gallop and no friction rub.   No murmur heard. Pulmonary/Chest: Effort normal and breath sounds normal. No stridor. No respiratory distress. She has no wheezes. She has no rales. She exhibits no tenderness.  Abdominal: Soft. Bowel sounds are normal. She exhibits no distension. There is no tenderness. There is no rebound and no guarding.  Genitourinary:  GU and breast exams were deferred at her request - she sees a GYN annually  Musculoskeletal: Normal range of motion. She exhibits no edema or tenderness.  Lymphadenopathy:    She has no cervical adenopathy.  Neurological: She is oriented to person, place, and time.  Skin: Skin is warm and dry. No rash noted. She is not diaphoretic. No erythema. No pallor.  Psychiatric: She has a normal mood and affect. Her behavior is normal. Judgment and thought content normal.  Vitals reviewed.   Lab Results  Component Value Date   WBC 8.7 02/13/2015   HGB 13.7 02/13/2015   HCT 41.6 02/13/2015   PLT 164.0 02/13/2015   GLUCOSE 140* 02/13/2015   CHOL 101 02/13/2015   TRIG 71.0 02/13/2015   HDL 51.40 02/13/2015   LDLDIRECT 40.1 03/09/2007   LDLCALC 35 02/13/2015   ALT 3 02/13/2015   AST 16 02/13/2015   NA 140 02/13/2015   K 4.5 02/13/2015   CL 101 02/13/2015   CREATININE 1.14 02/13/2015   BUN 17 02/13/2015   CO2 30 02/13/2015   TSH 0.87 09/12/2014   INR 1.03 07/10/2014   HGBA1C 6.3 02/13/2015   MICROALBUR 6.7* 02/13/2015    Dg Wrist Complete Left  07/10/2014  CLINICAL DATA:  Initial encounter for left wrist injury from a fall 3 days ago. EXAM: LEFT WRIST - COMPLETE 3+ VIEW COMPARISON:  Hand films  of 07/10/2014, dictated separately. FINDINGS: Radiocarpal osteoarthritis with joint space narrowing and subchondral sclerosis. No acute fracture or dislocation. Scaphoid intact. IMPRESSION: Degenerative change, without acute osseous finding. Electronically Signed   By: Jeronimo Greaves M.D.   On: 07/10/2014 15:30   Mr Laqueta Jean ZO Contrast  07/10/2014  CLINICAL DATA:  Fall 3 days ago. Dizziness. Diabetes. Hyperlipidemia. Obesity. Hypertension. Parkinson's. Vomiting. EXAM: MRI HEAD WITHOUT AND WITH CONTRAST TECHNIQUE: Multiplanar, multiecho pulse sequences of the brain and surrounding structures were obtained without and with intravenous contrast. CONTRAST:  15mL MULTIHANCE GADOBENATE DIMEGLUMINE 529 MG/ML IV SOLN COMPARISON:  MRI 11/19/2011 FINDINGS: Mild atrophy similar to the prior study.  Negative for hydrocephalus Negative for acute infarct. Chronic microvascular ischemic changes in the white matter similar to the prior study.  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 Mild chronic ischemia in the left pons. Negative for intracranial hemorrhage. Negative for mass or edema. Normal enhancement following contrast infusion. No enhancing mass lesion. Sinusitis with air-fluid level right maxillary sinus. Diffuse mucosal thickening in the paranasal sinuses. IMPRESSION: Atrophy and moderate chronic ischemic change.  No acute infarct. Sinusitis with air-fluid level right maxillary sinus. Electronically Signed   By: Marlan Palau M.D.   On: 07/10/2014 18:47   Dg Hand Complete Left  07/10/2014  CLINICAL DATA:  Pain following fall on outstretched hand EXAM: LEFT HAND - COMPLETE 3+ VIEW COMPARISON:  None. FINDINGS: Frontal, oblique, and lateral views were obtained. There is no fracture or dislocation. There is moderate narrowing of all MCP, PIP, and DIP joints. There is also osteoarthritic change in the saddle joint. No erosive change or periostitis. IMPRESSION: Multifocal osteoarthritic change.  No  fracture or dislocation. Electronically Signed   By: Bretta Bang III M.D.   On: 07/10/2014 15:27    Assessment & Plan:   Nikitta was seen today for annual exam and diabetes.  Diagnoses and all orders for this visit:  Essential hypertension- her BP is well controlled, lytes and renal function are stable -     CBC with Differential/Platelet; Future -     Urinalysis, Routine w reflex microscopic (not at [facility]); Future  Type 2 diabetes mellitus with complication, without long-term current use of insulin (HCC)- her blood sugars are well controlled, will cont metformin -     Hemoglobin A1c; Future -     Microalbumin / creatinine urine ratio; Future -     Ambulatory referral to Ophthalmology  Hyperlipidemia with target LDL less than 100- she has achieved her LDL goal and is doing well on the statin -     Lipid panel; Future -     Comprehensive metabolic panel; Future  Visit for screening mammogram -     MM DIGITAL SCREENING BILATERAL; Future  I have discontinued Ms. Campanella's Linaclotide. I am also having her maintain her aspirin, calcium carbonate, simethicone, multivitamin, b complex vitamins, vitamin C, cholecalciferol, fish oil-omega-3 fatty acids, zinc gluconate, onetouch ultrasoft, metFORMIN, ADACEL, fluticasone, carbidopa-levodopa, buPROPion, quinapril, atorvastatin, ipratropium, traZODone, glucose blood, furosemide, and lidocaine.  Meds ordered this encounter  Medications  . lidocaine (XYLOCAINE) 5 % ointment    Sig: APPLY 2-3 GRAMS TOPICALLY TO AFFECTED AREA 3-4 TIMES PER DAY. (APPLY ONLY TO A SINGLE BODY PART PER APPLICATION IE FEET OR HANDS)    Refill:  0   See AVS for instructions about healthy living and anticipatory guidance.  Follow-up: Return in about 6 months (around 08/13/2015).  Sanda Linger, MD

## 2015-02-14 NOTE — Assessment & Plan Note (Signed)

## 2015-02-27 ENCOUNTER — Other Ambulatory Visit: Payer: Self-pay | Admitting: Internal Medicine

## 2015-03-07 ENCOUNTER — Telehealth: Payer: Self-pay | Admitting: *Deleted

## 2015-03-07 MED ORDER — METFORMIN HCL 1000 MG PO TABS: 1000.0000 mg | ORAL_TABLET | Freq: Two times a day (BID) | ORAL | Status: DC

## 2015-03-07 NOTE — Telephone Encounter (Signed)
Received call pt states she is needing refill on her metformin. She states she is only taking one 1000 mg daily. Verified pharmacy inform will send to [facility],,,/lm,b

## 2015-03-21 ENCOUNTER — Ambulatory Visit: Payer: Medicare Other | Admitting: Diagnostic Neuroimaging

## 2015-04-18 ENCOUNTER — Ambulatory Visit (INDEPENDENT_AMBULATORY_CARE_PROVIDER_SITE_OTHER): Payer: Medicare Other | Admitting: Diagnostic Neuroimaging

## 2015-04-18 ENCOUNTER — Ambulatory Visit: Payer: Medicare Other | Admitting: Diagnostic Neuroimaging

## 2015-04-18 ENCOUNTER — Encounter: Payer: Self-pay | Admitting: Diagnostic Neuroimaging

## 2015-04-18 VITALS — BP 113/69 | HR 78 | Ht 63.0 in | Wt 150.6 lb

## 2015-04-18 DIAGNOSIS — G20 Parkinson's disease: Secondary | ICD-10-CM

## 2015-04-18 DIAGNOSIS — R269 Unspecified abnormalities of gait and mobility: Secondary | ICD-10-CM

## 2015-04-18 MED ORDER — CARBIDOPA-LEVODOPA 25-100 MG PO TABS: 2.0000 | ORAL_TABLET | Freq: Three times a day (TID) | ORAL | Status: DC

## 2015-04-18 NOTE — Patient Instructions (Signed)

## 2015-04-18 NOTE — Progress Notes (Signed)
PATIENT: Barbara Bradshaw DOB: [birth year]  REASON FOR VISIT: routine follow up for PD HISTORY FROM: patient, husband  Chief Complaint  Patient presents with  . Parkinson's disease    rm 7, husband - [name]  . Follow-up    6 month    HISTORY OF PRESENT ILLNESS:  UPDATE 04/18/15: Since last visit, stable. No more syncope. No falls. Gait stable. Carb/levo working well, and stable. No wearing off.   UPDATE 09/19/14: Since last visit, she had episode of passing out at home in July 08, 2014; was standing in kitchen, then passed out. Hit head on concrete slab flooring. LOC was witnessed by husband. Husband helped her sit up, and patient gradually woke up. She tried drinking OJ, but spit it out and vomited. They checked sugar, and it was 123. Went to urgent care on 07/10/14. They check MRI brain, and dx'd sinusitis and treated with abx. Otherwise PD symptoms are stable. Now on carb/levo 2 tabs TID.  UPDATE 03/14/14: Since last visit, right hand shaking more. Still struggles with back pain. No falls. Some balance issues. On carb/levo 25/100 (1.5 tabs BID; sometimes takes extra meds in mid day if has more tremor). More slowness/stiffness in early AM. Also notes more intermittent cold intolerance randomly through the day.  UPDATE 09/12/13 (LL): Since last visit, patient has started back into therapy and her depression is better under control.  Parkinson's disease symptoms are stable.  She has chronic constipation, only 1 BM/week. HH that was ordered at last visit did not occur, they never received any call. Carb/Levo is at same dose, misses mid-day dose occasionally. She has more back pain lately, she feels it is from compression fx that occurred in 2012.  UPDATE 05/25/13: Since last visit, multiple family tragedies: nephew and mother passed away. This has led to deep depression. Patient continues to struggle on daily basis, with fatigue, depression, crying. Other parkinson's dz symptoms are stable.  Still taking carb/levo, but misses mid-day dose many times.   UPDATE 03/14/13: Since last visit, doing well. Some wearing off symptoms noted when she wakes up, but not later in the day. Tolerating carb/levo 25/100, 1/5 tabs TID. No falls. She has some swallowing issues (feels food caught in mid-lower esophagus). Some nerve/anxiety issues, but manageable.   UPDATE 08/03/12: since last visit no new changes. Patient's tremor is under good control with her carbidopa/levodopa. Sometimes she forgets to take the midday dose. She takes her medications regularly, it seems to work well. If she misses a dose, she notices wearing off. Patient also reports mild short-term memory loss. No anxiety. Some constipation. I noted some oral-lingual dyskinesias today, which patient does not notice. Husband has noticed this. She attributes this to her ill fitting dentures.   UPDATE 03/08/12: Since last visit, doing better. Increased carb/levo seems to work better. Some off-time in early AM, otherise no waearing off. No side effects. No anxiety, constipation or falls. some swallow diff, but mild.   UPDATE 01/01/12: She feels like carbidopa-levodopa has helped 15%. She is tolerating carbidopa-levodopa well without dizziness, she has had occasional nausea. She takes her midday dose with her meals. Denies any falls. No change in smell and taste, bowel or bladder.   PRIOR HPI (Dr. Marjory Lies) 70 year old right-handed female with history of diabetes, hypertension, hyperlipidemia, here for evaluation of tremor for the past one year. Patient noted intermittent tremor when she is doing certain actions such as handwriting, cooking, holding objects. Her husband has noted some intermittent resting tremor.  Patient fell down in June 2012. She feels that her balance and walking have slightly worsened over time. Husband has noticed change in her voice. Patient having a little bit more anxiety, as well as very vivid dreams lately. Patient has family  history Parkinson's disease in her sister and her father. No change in sense of smell or taste.   REVIEW OF SYSTEMS: Full 14 system review of systems performed and notable only for decr concentration memory loss agitation trouble swallowing drooling back pain walking diff anxiety back pain.    ALLERGIES: No Known Allergies  HOME MEDICATIONS: Outpatient Prescriptions Prior to Visit  Medication Sig Dispense Refill  . ADACEL 07-30-13.5 LF-MCG/0.5 injection     . aspirin 81 MG tablet Take 81 mg by mouth daily.      Marland Kitchen atorvastatin (LIPITOR) 40 MG tablet TAKE 1 TABLET BY MOUTH DAILY 90 tablet 3  . b complex vitamins capsule Take 1 capsule by mouth daily.    Marland Kitchen buPROPion (WELLBUTRIN XL) 150 MG 24 hr tablet TAKE 1 TABLET BY MOUTH EVERY DAY 30 tablet 11  . calcium carbonate (OS-CAL) 600 MG TABS Take 600 mg by mouth daily.      . cholecalciferol (VITAMIN D) 1000 UNITS tablet Take 1,000 Units by mouth daily.    . fish oil-omega-3 fatty acids 1000 MG capsule Take 1 g by mouth daily.    . fluticasone (FLONASE) 50 MCG/ACT nasal spray Place 2 sprays into both nostrils daily. 16 g 11  . furosemide (LASIX) 40 MG tablet TAKE 1 TABLET BY MOUTH EVERY DAY 90 tablet 0  . glucose blood (ONE TOUCH ULTRA TEST) test strip 1 each by Other route daily. Use to check blood sugars daily E11.8 100 each 11  . ipratropium (ATROVENT) 0.03 % nasal spray Place 2 sprays into both nostrils 2 (two) times daily. 30 mL 0  . Lancets (ONETOUCH ULTRASOFT) lancets Use as instructed 100 each 12  . lidocaine (XYLOCAINE) 5 % ointment APPLY 2-3 GRAMS TOPICALLY TO AFFECTED AREA 3-4 TIMES PER DAY. (APPLY ONLY TO A SINGLE BODY PART PER APPLICATION IE FEET OR HANDS)  0  . metFORMIN (GLUCOPHAGE) 1000 MG tablet Take 1 tablet (1,000 mg total) by mouth 2 (two) times daily. 90 tablet 3  . Multiple Vitamin (MULTIVITAMIN) tablet Take 1 tablet by mouth daily.    . quinapril (ACCUPRIL) 10 MG tablet TAKE 1 TABLET BY MOUTH DAILY 90 tablet 3  .  simethicone (MYLICON) 125 MG chewable tablet Chew 125 mg by mouth every 6 (six) hours as needed.      . traZODone (DESYREL) 50 MG tablet TAKE 1 TABLET BY MOUTH AT BEDTIME 30 tablet 11  . vitamin C (ASCORBIC ACID) 500 MG tablet Take 500 mg by mouth daily.    Marland Kitchen zinc gluconate 50 MG tablet Take 50 mg by mouth daily.    . carbidopa-levodopa (SINEMET IR) 25-100 MG per tablet Take 2 tablets by mouth 3 (three) times daily. 540 tablet 4   No facility-administered medications prior to visit.     PHYSICAL EXAM  Filed Vitals:   04/18/15 1303  BP: 113/69  Pulse: 78  Height:  (1.6 m)  Weight: 150 lb 9.6 oz (68.312 kg)   Body mass index is 26.68 kg/(m^2). No exam data present  Neurologic Exam  Mental Status: Awake, alert. Language is fluent and comprehension intact. NEG MYERSONS. NEG SNOUT. MASKED FACIES. Cranial Nerves: Pupils are equal and reactive to light. Visual fields are full to confrontation. Conjugate eye  movements are full and symmetric; EXCEPT FOR RIGHT EXO/HYPERTROPIA. Facial sensation and strength are symmetric. Hearing is intact. Palate elevated symmetrically and uvula is midline. Shoulder shrug is symmetric. Tongue is midline.  Motor: Normal bulk. MILD ACTION/POSTURAL. NO REST TREMOR. BRADYKINESIA IN LUE>RUE. COGWHEELING IN LUE. Full strength in the upper and lower extremities. No pronator drift.  Sensory: Intact and symmetric to light touch and temperature  Coordination: No ataxia or dysmetria on finger-nose or rapid alternating movement testing.  Reflexes: Deep tendon reflexes in the upper and lower extremity are present and symmetric; TRACE AT ANKLES. Gait and Station: STOOPED POSTURE. DECR ARM SWING. UNSTEADY, SLOW GAIT.    DIAGNOSTIC DATA (LABS, IMAGING, TESTING) - I reviewed patient records, labs, notes, testing and imaging myself where available.  Lab Results  Component Value Date   HGBA1C 6.3 02/13/2015   Lab Results  Component Value Date   VITAMINB12 546  10/21/2011   Lab Results  Component Value Date   TSH 0.87 09/12/2014    11/19/11 MRI of brain - multiple round and ovoid, periventricular, subcortical, thalamic and pontine foci of T2 hyperintensity likely representative of chronic small vessel ischemic disease.    ASSESSMENT AND PLAN  70 y.o. 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 She has 4 of the 4 cardinal features for Parkinson's disease. On carb/levo 25/100 (2 tabs TID).  Dx: idiopathic parkinson's disease + syncope event (07/08/14)  PLAN:  1. Continue carbidopa-levodopa 2 tabs TID 2. Monitor BP for hypotension 3. Use cane or walker for fall risk reduction  Meds ordered this encounter  Medications  . carbidopa-levodopa (SINEMET IR) 25-100 MG tablet    Sig: Take 2 tablets by mouth 3 (three) times daily.    Dispense:  540 tablet    Refill:  4   Return in about 6 months (around 10/16/2015).    Suanne Marker, MD 04/18/2015, 1:19 PM Certified in Neurology, Neurophysiology and Neuroimaging  [facility] [address] [place], [place] [address] [phone]

## 2015-05-07 DIAGNOSIS — H2513 Age-related nuclear cataract, bilateral: Secondary | ICD-10-CM | POA: Diagnosis not present

## 2015-05-07 DIAGNOSIS — H5211 Myopia, right eye: Secondary | ICD-10-CM | POA: Diagnosis not present

## 2015-05-07 DIAGNOSIS — H53001 Unspecified amblyopia, right eye: Secondary | ICD-10-CM | POA: Diagnosis not present

## 2015-05-07 DIAGNOSIS — H5202 Hypermetropia, left eye: Secondary | ICD-10-CM | POA: Diagnosis not present

## 2015-05-07 LAB — HM DIABETES EYE EXAM

## 2015-05-11 ENCOUNTER — Other Ambulatory Visit: Payer: Self-pay | Admitting: Internal Medicine

## 2015-05-21 ENCOUNTER — Encounter: Payer: Self-pay | Admitting: Internal Medicine

## 2015-05-24 ENCOUNTER — Other Ambulatory Visit: Payer: Self-pay | Admitting: Internal Medicine

## 2015-06-08 ENCOUNTER — Other Ambulatory Visit: Payer: Self-pay | Admitting: Diagnostic Neuroimaging

## 2015-06-19 ENCOUNTER — Ambulatory Visit: Payer: Medicare Other

## 2015-08-13 ENCOUNTER — Ambulatory Visit: Payer: Medicare Other | Admitting: Internal Medicine

## 2015-08-16 ENCOUNTER — Other Ambulatory Visit: Payer: Self-pay | Admitting: Internal Medicine

## 2015-08-21 ENCOUNTER — Encounter (HOSPITAL_COMMUNITY): Payer: Self-pay

## 2015-08-21 ENCOUNTER — Other Ambulatory Visit: Payer: Self-pay | Admitting: Internal Medicine

## 2015-08-23 DIAGNOSIS — G20 Parkinson's disease: Secondary | ICD-10-CM | POA: Diagnosis not present

## 2015-08-23 DIAGNOSIS — Z1239 Encounter for other screening for malignant neoplasm of breast: Secondary | ICD-10-CM | POA: Diagnosis not present

## 2015-08-23 DIAGNOSIS — E785 Hyperlipidemia, unspecified: Secondary | ICD-10-CM | POA: Diagnosis not present

## 2015-08-23 DIAGNOSIS — E119 Type 2 diabetes mellitus without complications: Secondary | ICD-10-CM | POA: Diagnosis not present

## 2015-09-16 LAB — HEPATIC FUNCTION PANEL
ALT: 5 U/L — AB (ref 7–35)
AST: 15 U/L (ref 13–35)
Alkaline Phosphatase: 63 U/L (ref 25–125)
BILIRUBIN, TOTAL: 0.5 mg/dL

## 2015-09-16 LAB — POCT INR: INR: 1 (ref 0.9–1.1)

## 2015-09-19 LAB — BASIC METABOLIC PANEL
BUN: 22 mg/dL — AB (ref 4–21)
Creatinine: 0.9 mg/dL (ref 0.5–1.1)
POTASSIUM: 4.1 mmol/L (ref 3.4–5.3)
Sodium: 136 mmol/L — AB (ref 137–147)

## 2015-09-19 LAB — CBC AND DIFFERENTIAL
HCT: 26 % — AB (ref 36–46)
Hemoglobin: 8.7 g/dL — AB (ref 12.0–16.0)
PLATELETS: 104 10*3/uL — AB (ref 150–399)
WBC: 3.7 10^3/mL

## 2015-09-20 ENCOUNTER — Encounter: Payer: Self-pay | Admitting: Internal Medicine

## 2015-09-20 ENCOUNTER — Non-Acute Institutional Stay (SKILLED_NURSING_FACILITY): Payer: Medicare Other | Admitting: Internal Medicine

## 2015-09-20 DIAGNOSIS — S72141S Displaced intertrochanteric fracture of right femur, sequela: Secondary | ICD-10-CM | POA: Diagnosis not present

## 2015-09-20 DIAGNOSIS — E785 Hyperlipidemia, unspecified: Secondary | ICD-10-CM

## 2015-09-20 DIAGNOSIS — E119 Type 2 diabetes mellitus without complications: Secondary | ICD-10-CM

## 2015-09-20 DIAGNOSIS — R600 Localized edema: Secondary | ICD-10-CM | POA: Diagnosis not present

## 2015-09-20 DIAGNOSIS — G20 Parkinson's disease: Secondary | ICD-10-CM | POA: Diagnosis not present

## 2015-09-20 DIAGNOSIS — G4700 Insomnia, unspecified: Secondary | ICD-10-CM | POA: Diagnosis not present

## 2015-09-20 DIAGNOSIS — R2681 Unsteadiness on feet: Secondary | ICD-10-CM

## 2015-09-20 DIAGNOSIS — F329 Major depressive disorder, single episode, unspecified: Secondary | ICD-10-CM

## 2015-09-20 DIAGNOSIS — D62 Acute posthemorrhagic anemia: Secondary | ICD-10-CM | POA: Diagnosis not present

## 2015-09-20 DIAGNOSIS — K5901 Slow transit constipation: Secondary | ICD-10-CM

## 2015-09-20 DIAGNOSIS — D696 Thrombocytopenia, unspecified: Secondary | ICD-10-CM | POA: Diagnosis not present

## 2015-09-20 DIAGNOSIS — G20A1 Parkinson's disease without dyskinesia, without mention of fluctuations: Secondary | ICD-10-CM

## 2015-09-20 NOTE — Progress Notes (Signed)
LOCATION: [facility]  PCP: Sanda Lingerhomas Jones, MD   Code Status: Full Code  Goals of care: Advanced Directive information Advanced Directives 04/18/2015  Does patient have an advance directive? Yes  Type of Estate agentAdvance Directive Healthcare Power of [place];Living will  Does patient want to make changes to advanced directive? -  Copy of advanced directive(s) in chart? No - copy requested       Extended Emergency Contact Information Primary Emergency Contact: Griffo,Robert Address: [address]          [place], [place] [address] [place] Home Phone: [phone] Relation: Spouse   No Known Allergies  Chief Complaint  Patient presents with  . New Admit To SNF    New Admission     HPI:  Patient is a 70 y.o. female seen today for short term rehabilitation post hospital admission from 09/16/15-09/19/15 with closed right hip fracture post fall. She underwent ORIF with IM nailing on 09/16/15. She is seen in her room today with her husband present. She denies any concern. Her pain is under control with current pain regimen.   Review of Systems:  Constitutional: Negative for fever, chills, diaphoresis. Energy level slowly returning.  HENT: Negative for headache, congestion, nasal discharge, sore throat, difficulty swallowing.   Eyes: Negative for blurred vision, double vision and discharge.  Respiratory: Negative for cough, shortness of breath and wheezing.   Cardiovascular: Negative for chest pain, palpitations, leg swelling.  Gastrointestinal: Negative for heartburn, nausea, vomiting, abdominal pain. Last bowel movement was yesterday. No melena Genitourinary: Negative for dysuria and flank pain.  Musculoskeletal: Negative for back pain, fall in the facility.  Skin: Negative for itching, rash.  Neurological: Positive for occasional dizziness. Has history of parkinson's disease.  Psychiatric/Behavioral: Negative for depression   Past Medical History  Diagnosis Date    . Diabetes mellitus, type 2 (HCC)   . Hypertension   . Rosacea   . Nephrolithiasis   . Hyperlipidemia   . Parkinson's disease (HCC) 12/2011  . Parkinson disease [facility](HCC)    Past Surgical History  Procedure Laterality Date  . Tubal ligation    . Right arm fx with orif     Social History:   reports that she has never smoked. She has never used smokeless tobacco. She reports that she does not drink alcohol or use illicit drugs.  Family History  Problem Relation Age of Onset  . Dementia Mother   . Cancer Mother     breast  . Heart disease Mother   . Hypertension Mother   . Diabetes Father   . Colon cancer Neg Hx   . Stomach cancer Neg Hx     Medications:   Medication List       This list is accurate as of: 09/20/15 11:06 AM.  Always use your most recent med list.               acetaminophen 500 MG tablet  Commonly known as:  TYLENOL  Take 500 mg by mouth every 6 (six) hours as needed.     ADACEL 07-30-13.5 LF-MCG/0.5 injection  Generic drug:  Tdap     aluminum-magnesium hydroxide-simethicone 200-200-20 MG/5ML Susp  Commonly known as:  MAALOX  Take 30 mLs by mouth 4 (four) times daily as needed.     aspirin 81 MG tablet  Take 81 mg by mouth daily.     aspirin 325 MG EC tablet  Take 325 mg by mouth 2 (two) times daily. Stop date  10/09/15     atorvastatin 20 MG tablet  Commonly known as:  LIPITOR  Take 20 mg by mouth daily.     bisacodyl 5 MG EC tablet  Commonly known as:  DULCOLAX  Take 10 mg by mouth daily as needed for moderate constipation.     bisacodyl 10 MG suppository  Commonly known as:  DULCOLAX  Place 10 mg rectally daily as needed for moderate constipation.     buPROPion 150 MG 24 hr tablet  Commonly known as:  WELLBUTRIN XL  TAKE 1 TABLET BY MOUTH EVERY DAY     Calcium Carbonate-Vitamin D 600-400 MG-UNIT tablet  Take 1 tablet by mouth daily.     carbidopa-levodopa 25-100 MG tablet  Commonly known as:  SINEMET IR  Take 2 tablets by mouth 3  (three) times daily.     cholecalciferol 1000 units tablet  Commonly known as:  VITAMIN D  Take 1,000 Units by mouth daily.     cyclobenzaprine 5 MG tablet  Commonly known as:  FLEXERIL  Take 5 mg by mouth 3 (three) times daily as needed for muscle spasms.     docusate sodium 100 MG capsule  Commonly known as:  COLACE  Take 100 mg by mouth daily.     fish oil-omega-3 fatty acids 1000 MG capsule  Take 1 g by mouth daily.     furosemide 20 MG tablet  Commonly known as:  LASIX  Take 20 mg by mouth daily.     glucose blood test strip  Commonly known as:  ONE TOUCH ULTRA TEST  1 each by Other route daily. Use to check blood sugars daily E11.8     ipratropium 0.03 % nasal spray  Commonly known as:  ATROVENT  Place 2 sprays into both nostrils 2 (two) times daily.     metFORMIN 1000 MG tablet  Commonly known as:  GLUCOPHAGE  Take 1,000 mg by mouth 2 (two) times daily with a meal.     multivitamin tablet  Take 1 tablet by mouth daily.     onetouch ultrasoft lancets  Use as instructed     traMADol 50 MG tablet  Commonly known as:  ULTRAM  Take 50 mg by mouth every 6 (six) hours as needed for moderate pain.     traZODone 50 MG tablet  Commonly known as:  DESYREL  TAKE 1 TABLET BY MOUTH AT BEDTIME     UNABLE TO FIND  Med Name: Med pass 120 ml 2 times daily        Immunizations: Immunization History  Administered Date(s) Administered  . Influenza Split 12/15/2011  . Influenza Whole 12/29/1997, 01/10/2008, 02/12/2009, 12/21/2009  . Pneumococcal Conjugate-13 05/23/2013  . Pneumococcal Polysaccharide-23 01/14/2010  . Td 06/03/2003  . Tdap 08/10/2013  . Zoster 12/12/2010     Physical Exam: Filed Vitals:   09/20/15 1049  BP: 132/62  Pulse: 94  Temp: 99.8 F (37.7 C)  TempSrc: Oral  Resp: 20  Height: 5\' 4"  (1.626 m)  Weight: 142 lb (64.411 kg)  SpO2: 98%   Body mass index is 24.36 kg/(m^2).  General- elderly female, well built, in no acute distress Head-  normocephalic, brusie to right orbital area and right forehead Nose- no maxillary or frontal sinus tenderness, no nasal discharge Throat- moist mucus membrane Eyes- PERRLA, EOMI, no pallor, no icterus, no discharge, normal conjunctiva, normal sclera Neck- no cervical lymphadenopathy Cardiovascular- normal s1,s2, no murmur, trace leg edema Respiratory- bilateral clear to auscultation, no wheeze, no  rhonchi, no crackles, no use of accessory muscles Abdomen- bowel sounds present, soft, non tender Musculoskeletal- able to move all 4 extremities, limited right hip range of motion Neurological- alert and oriented to person, place and time Skin- warm and dry, bruise to right leg, 2 surgical incision to RLE with staples in place and clean and dry dressing Psychiatry- normal mood and affect    Labs reviewed: Basic Metabolic Panel:  Recent Labs  16/10/96 0925 09/19/15  NA 140 136*  K 4.5 4.1  CL 101  --   CO2 30  --   GLUCOSE 140*  --   BUN 17 22*  CREATININE 1.14 0.9  CALCIUM 10.8*  --    Liver Function Tests:  Recent Labs  02/13/15 0925 09/16/15  AST 16 15  ALT 3 5*  ALKPHOS 81 63  BILITOT 0.6  --   PROT 7.1  --   ALBUMIN 4.3  --    No results for input(s): LIPASE, AMYLASE in the last 8760 hours. No results for input(s): AMMONIA in the last 8760 hours. CBC:  Recent Labs  02/13/15 0925 09/19/15  WBC 8.7 3.7  NEUTROABS 5.3  --   HGB 13.7 8.7*  HCT 41.6 26*  MCV 88.0  --   PLT 164.0 104*    Radiological Exams: Xr Chest Portable 09/16/15 Impression. No acute cardiopulmonary disease.  Xr Hip 2 Views Right 09/17/15 Impression. Status post internal fixation of right femoral intertrochanteric fracture in grossly anatomic alignment.  Xr Hip 2 Views Right 09/16/15 Impression. Intertrochanteric fracture of the proximal right femur.  Ct Head Wo Contrast Cervical Spine Wo Contrast 09/16/15 Impression. No skull fracture or intracranial hemorrhage. Chronic changes as  described. RIGHT lateral orbital hematoma without visible injury to the zygoma, orbital contents, or orbital wall. No cervical spine fracture or traumatic subluxation. Cervical spondylosis most notable at C5-6. BILATERAL thyroid lesions potentially meet criteria for biopsy. Recommend elective thyroid sonography.  Assessment/Plan  Unsteady gait With her parkinson's and recent fall and fracture. Will have patient work with PT/OT as tolerated to regain strength and restore function.  Fall precautions are in place.  Right hip fracture S/p ORIF and IM nailing. Has orthopedics follow up. Will have her work with physical therapy and occupational therapy team to help with gait training and muscle strengthening exercises.fall precautions. Skin care. Encourage to be out of bed. Continue tylenol 500 mg qid for pain and flexeril 5 mg tid prn muscle spasm. Continue tramadol 50 mg q6h prn pain. Continue aspirin ec 325 mg bid for dvt prophylaxis for 3 weeks, then aspirin 81 mg daily. Continue calcium and vitamin d.   Blood loss anemia Post op, monitor cbc  Slow transit constipation Stable at present, continue colace 100 mg daily and dulcolax as needed  Thrombocytopenia No bleed reported, on aspirin, monitor platelet count  HLD Continue lipitor  Depression Stable, continue wellbutrin 150 mg daily  Parkinson's disease Monitor clinically, continue sinemet   Leg edema Add ted hose, continue lasix 20 mg daily  Insomnia Stable with trazodone, monitor  DM type 2 Monitor cbg. Continue metformin 1000 mg bid Lab Results  Component Value Date   HGBA1C 6.3 02/13/2015      Goals of care: short term rehabilitation   Labs/tests ordered: cbc, cmp 09/24/15  Family/ staff Communication: reviewed care plan with patient and nursing supervisor    Oneal Grout, MD Internal Medicine [facility] [address] [place], [place] [address] Cell Phone (Monday-Friday  8  am - 5 pm): [phone] On Call: [phone] and follow prompts after 5 pm and on weekends Office Phone: [phone] Office Fax: [phone]

## 2015-09-21 LAB — BASIC METABOLIC PANEL
BUN: 14 mg/dL (ref 4–21)
Creatinine: 0.8 mg/dL (ref 0.5–1.1)
GLUCOSE: 134 mg/dL
POTASSIUM: 4.2 mmol/L (ref 3.4–5.3)
SODIUM: 142 mmol/L (ref 137–147)

## 2015-09-21 LAB — CBC AND DIFFERENTIAL
HCT: 29 % — AB (ref 36–46)
Hemoglobin: 9.7 g/dL — AB (ref 12.0–16.0)
Neutrophils Absolute: 3 /uL
Platelets: 200 10*3/uL (ref 150–399)
WBC: 6 10^3/mL

## 2015-09-28 ENCOUNTER — Encounter: Payer: Self-pay | Admitting: Diagnostic Neuroimaging

## 2015-10-05 ENCOUNTER — Encounter: Payer: Self-pay | Admitting: Adult Health

## 2015-10-05 ENCOUNTER — Non-Acute Institutional Stay (SKILLED_NURSING_FACILITY): Payer: Medicare Other | Admitting: Adult Health

## 2015-10-05 DIAGNOSIS — E785 Hyperlipidemia, unspecified: Secondary | ICD-10-CM | POA: Diagnosis not present

## 2015-10-05 DIAGNOSIS — D62 Acute posthemorrhagic anemia: Secondary | ICD-10-CM

## 2015-10-05 DIAGNOSIS — G20 Parkinson's disease: Secondary | ICD-10-CM | POA: Diagnosis not present

## 2015-10-05 DIAGNOSIS — G4700 Insomnia, unspecified: Secondary | ICD-10-CM

## 2015-10-05 DIAGNOSIS — S72141S Displaced intertrochanteric fracture of right femur, sequela: Secondary | ICD-10-CM | POA: Diagnosis not present

## 2015-10-05 DIAGNOSIS — G20A1 Parkinson's disease without dyskinesia, without mention of fluctuations: Secondary | ICD-10-CM

## 2015-10-05 DIAGNOSIS — F329 Major depressive disorder, single episode, unspecified: Secondary | ICD-10-CM

## 2015-10-05 DIAGNOSIS — K5901 Slow transit constipation: Secondary | ICD-10-CM

## 2015-10-05 DIAGNOSIS — R600 Localized edema: Secondary | ICD-10-CM

## 2015-10-05 DIAGNOSIS — R2681 Unsteadiness on feet: Secondary | ICD-10-CM | POA: Diagnosis not present

## 2015-10-05 DIAGNOSIS — E119 Type 2 diabetes mellitus without complications: Secondary | ICD-10-CM

## 2015-10-05 NOTE — Progress Notes (Signed)
Patient ID: Barbara SpikeBrenda M Moor, female   DOB: [birth year], 70 y.o.   MRN: [id number]    DATE:  10/05/2015   MRN:  [id number]  BIRTHDAY: [birth year]  Facility:  Nursing Home Location:  [facility]  Nursing Home Room Number: 104-P  LEVEL OF CARE:  SNF (31)  Contact Information    Name Relation Home Work Mobile   Augustaline,Robert Spouse [phone]     Jennefer BravoCline,Allen Son   [phone]       Code Status History    This patient does not have a recorded code status. Please follow your organizational policy for patients in this situation.    Advance Directive Documentation        Most Recent Value   Type of Advance Directive  Out of facility DNR (pink MOST or yellow form)   Pre-existing out of facility DNR order (yellow form or pink MOST form)     "MOST" Form in Place?         Chief Complaint  Patient presents with  . Discharge Note    HISTORY OF PRESENT ILLNESS:  This is a 70 year old female who is for discharge home with Home health CNA, PT, and Nursing. DME:  Bedside Commode and Rolling walker  [facility] with closed right hip fracture for which she had ORIF with IM nailing on 09/16/15.  Patient was admitted to this facility for short-term rehabilitation after the patient's recent hospitalization.  Patient has completed SNF rehabilitation and therapy has cleared the patient for discharge.    PAST MEDICAL HISTORY:  Past Medical History  Diagnosis Date  . Diabetes mellitus, type 2 (HCC)   . Hypertension   . Rosacea   . Nephrolithiasis   . Hyperlipidemia   . Parkinson's disease (HCC) 12/2011  . Unsteady gait   . Intertrochanteric fracture of right hip (HCC)   . Acute blood loss anemia   . Thrombocytopenia (HCC)   . Slow transit constipation   . Major depression, chronic (HCC)   . Insomnia   . Bilateral edema of lower extremity      CURRENT MEDICATIONS: Reviewed  Patient's  Medications  New Prescriptions   No medications on file  Previous Medications   ACETAMINOPHEN (TYLENOL) 500 MG TABLET    Take 500 mg by mouth every 6 (six) hours as needed.   ADACEL 07-30-13.5 LF-MCG/0.5 INJECTION    Reported on 10/05/2015   ALUMINUM-MAGNESIUM HYDROXIDE-SIMETHICONE (MAALOX) 200-200-20 MG/5ML SUSP    Take 30 mLs by mouth 4 (four) times daily as needed.   ASPIRIN 325 MG EC TABLET    Take 325 mg by mouth 2 (two) times daily. Stop date 10/09/15   ASPIRIN 81 MG TABLET    Take 81 mg by mouth daily.     ATORVASTATIN (LIPITOR) 20 MG TABLET    Take 20 mg by mouth daily.   BISACODYL (DULCOLAX) 10 MG SUPPOSITORY    Place 10 mg rectally daily as needed for moderate constipation.   BISACODYL (DULCOLAX) 5 MG EC TABLET    Take 10 mg by mouth daily as needed for moderate constipation.   BUPROPION (WELLBUTRIN XL) 150 MG 24 HR TABLET    TAKE 1 TABLET BY MOUTH EVERY DAY   CALCIUM CARBONATE-VITAMIN D 600-400 MG-UNIT TABLET    Take 1 tablet by mouth daily.   CARBIDOPA-LEVODOPA (SINEMET IR) 25-100 MG TABLET    Take 2 tablets by mouth 3 (three) times daily.  CHOLECALCIFEROL (VITAMIN D) 1000 UNITS TABLET    Take 1,000 Units by mouth daily. D3   CYCLOBENZAPRINE (FLEXERIL) 5 MG TABLET    Take 5 mg by mouth 3 (three) times daily as needed for muscle spasms.   DOCUSATE SODIUM (COLACE) 100 MG CAPSULE    Take 100 mg by mouth daily.   FISH OIL-OMEGA-3 FATTY ACIDS 1000 MG CAPSULE    Take 1 g by mouth daily.   FUROSEMIDE (LASIX) 20 MG TABLET    Take 20 mg by mouth daily.   GLUCOSE BLOOD (ONE TOUCH ULTRA TEST) TEST STRIP    1 each by Other route daily. Use to check blood sugars daily E11.8   IPRATROPIUM (ATROVENT) 0.03 % NASAL SPRAY    Place 2 sprays into both nostrils 2 (two) times daily.   LANCETS (ONETOUCH ULTRASOFT) LANCETS    Use as instructed   METFORMIN (GLUCOPHAGE) 1000 MG TABLET    Take 1,000 mg by mouth 2 (two) times daily with a meal.   MULTIPLE VITAMIN (MULTIVITAMIN) TABLET    Take 1 tablet by mouth  daily.   TRAMADOL (ULTRAM) 50 MG TABLET    Take 50 mg by mouth every 6 (six) hours as needed for moderate pain.   TRAZODONE (DESYREL) 50 MG TABLET    TAKE 1 TABLET BY MOUTH AT BEDTIME   UNABLE TO FIND    Med Name: Med pass 120 ml 2 times daily  Modified Medications   No medications on file  Discontinued Medications   No medications on file     No Known Allergies   REVIEW OF SYSTEMS:  GENERAL: no change in appetite, no fatigue, no weight changes, no fever, chills or weakness EYES: Denies change in vision, dry eyes, eye pain, itching or discharge EARS: Denies change in hearing, ringing in ears, or earache NOSE: Denies nasal congestion or epistaxis MOUTH and THROAT: Denies oral discomfort, gingival pain or bleeding, pain from teeth or hoarseness   RESPIRATORY: no cough, SOB, DOE, wheezing, hemoptysis CARDIAC: no chest pain, edema or palpitations GI: no abdominal pain, diarrhea, constipation, heart burn, nausea or vomiting GU: Denies dysuria, frequency, hematuria, incontinence, or discharge PSYCHIATRIC: Denies feeling of depression or anxiety. No report of hallucinations, insomnia, paranoia, or agitation   PHYSICAL EXAMINATION  GENERAL APPEARANCE: Well nourished. In no acute distress. Normal body habitus SKIN:  Right thigh surgical incision is healed HEAD: Normal in size and contour. No evidence of trauma EYES: Lids open and close normally. No blepharitis, entropion or ectropion. PERRL. Conjunctivae are clear and sclerae are white. Lenses are without opacity EARS: Pinnae are normal. Patient hears normal voice tunes of the examiner MOUTH and THROAT: Lips are without lesions. Oral mucosa is moist and without lesions. Tongue is normal in shape, size, and color and without lesions NECK: supple, trachea midline, no neck masses, no thyroid tenderness, no thyromegaly LYMPHATICS: no LAN in the neck, no supraclavicular LAN RESPIRATORY: breathing is even & unlabored, BS CTAB CARDIAC: RRR, no  murmur,no extra heart sounds, no edema GI: abdomen soft, normal BS, no masses, no tenderness, no hepatomegaly, no splenomegaly EXTREMITIES:  Able to move X 4 extremities  PSYCHIATRIC: Alert and oriented X 3. Affect and behavior are appropriate  LABS/RADIOLOGY: Labs reviewed: Basic Metabolic Panel:  Recent Labs  40/98/11 0925 09/19/15 09/21/15  NA 140 136* 142  K 4.5 4.1 4.2  CL 101  --   --   CO2 30  --   --   GLUCOSE 140*  --   --  BUN 17 22* 14  CREATININE 1.14 0.9 0.8  CALCIUM 10.8*  --   --    Liver Function Tests:  Recent Labs  02/13/15 0925 09/16/15  AST 16 15  ALT 3 5*  ALKPHOS 81 63  BILITOT 0.6  --   PROT 7.1  --   ALBUMIN 4.3  --    CBC:  Recent Labs  02/13/15 0925 09/19/15 09/21/15  WBC 8.7 3.7 6.0  NEUTROABS 5.3  --  3  HGB 13.7 8.7* 9.7*  HCT 41.6 26* 29*  MCV 88.0  --   --   PLT 164.0 104* 200   Lipid Panel:  Recent Labs  02/13/15 0925  HDL 51.40       ASSESSMENT/PLAN:  Unsteady gait - for Home health PT, OT, CNA and Nursing  Right hip fracture S/P ORIF and IM nailing - follow-up with orthopedics; continue tramadol 50 mg 1 tab by mouth every 6 hours when necessary and acetaminophen 500 mg 1 tab by mouth every 6 hours for pain; aspirin 325 mg EC 1 tab by mouth twice a day until 10/09/15 for DVT prophylaxis; cyclobenzaprine 5 mg 1 tab by mouth 3 times a day when necessary for muscle spasm  Anemia, acute blood loss -  stable Lab Results  Component Value Date   HGB 9.7* 09/21/2015   Constipation - continue bisacodyl EC 5 mg 2 tabs = 10 mg by mouth daily when necessary, bisacodyl 10 mg suppository insert 1 suppository into the rectum daily when necessary and Colace 100 mg 1 capsule by mouth daily  Hyperlipidemia - continue atorvastatin 20 mg 1 tab by mouth daily at bedtime Lab Results  Component Value Date   CHOL 101 02/13/2015   HDL 51.40 02/13/2015   LDLCALC 35 02/13/2015   LDLDIRECT 40.1 03/09/2007   TRIG 71.0 02/13/2015    CHOLHDL 2 02/13/2015   Depression - mood this is stable; continue Wellbutrin 150 mg XL 1 tab by mouth daily  Parkinson's disease - continue Sinemet 25-100 mg 2 tabs by mouth 3 times a day  Bilateral lower extremity edema - continue Lasix 20 mg 1 tab by mouth daily  Insomnia - continue trazodone 50 mg 1 tab by mouth daily at bedtime  Diabetes mellitus, type II - continue metformin 1000 mg 1 tab by mouth twice a day Lab Results  Component Value Date   HGBA1C 6.3 02/13/2015       I have filled out patient's discharge paperwork and written prescriptions.  Patient will receive home health PT, OT, Nursing and CNA.  DME provided:  Bedside commode and Roiling walker  Total discharge time: Greater than 30 minutes  Discharge time involved coordination of the discharge process with social worker, nursing staff and therapy department. Medical justification for home health services/DME verified.     Kenard GowerMonina Medina-Vargas, NP [facility] [phone]

## 2015-10-17 ENCOUNTER — Ambulatory Visit: Payer: Medicare Other | Admitting: Diagnostic Neuroimaging

## 2015-10-18 ENCOUNTER — Other Ambulatory Visit: Payer: Self-pay | Admitting: Internal Medicine

## 2015-10-29 ENCOUNTER — Other Ambulatory Visit: Payer: Self-pay | Admitting: Internal Medicine

## 2015-11-04 ENCOUNTER — Other Ambulatory Visit: Payer: Self-pay | Admitting: Internal Medicine

## 2015-11-12 ENCOUNTER — Ambulatory Visit: Payer: Medicare Other | Admitting: Diagnostic Neuroimaging

## 2015-11-13 ENCOUNTER — Encounter: Payer: Self-pay | Admitting: Diagnostic Neuroimaging

## 2015-11-13 ENCOUNTER — Ambulatory Visit (INDEPENDENT_AMBULATORY_CARE_PROVIDER_SITE_OTHER): Payer: Medicare Other | Admitting: Diagnostic Neuroimaging

## 2015-11-13 VITALS — BP 129/78 | HR 79 | Wt 145.2 lb

## 2015-11-13 DIAGNOSIS — G20 Parkinson's disease: Secondary | ICD-10-CM | POA: Diagnosis not present

## 2015-11-13 DIAGNOSIS — R269 Unspecified abnormalities of gait and mobility: Secondary | ICD-10-CM | POA: Diagnosis not present

## 2015-11-13 DIAGNOSIS — G903 Multi-system degeneration of the autonomic nervous system: Secondary | ICD-10-CM

## 2015-11-13 MED ORDER — CARBIDOPA-LEVODOPA 25-100 MG PO TABS: 2.0000 | ORAL_TABLET | Freq: Three times a day (TID) | ORAL | 4 refills | Status: DC

## 2015-11-13 NOTE — Addendum Note (Signed)
Addended by: [place] PinkHESSON, Taji Barretto C on: 11/13/2015 04:58 PM   Modules accepted: Orders

## 2015-11-13 NOTE — Progress Notes (Signed)
PATIENT: Barbara Bradshaw DOB: 1945-05-04  REASON FOR VISIT: routine follow up for PD HISTORY FROM: patient, husband  Chief Complaint  Patient presents with  . Other    rm 6, Parkinson's Disease, husband- Barbara MaduroRobert  . Follow-up    6 month    HISTORY OF PRESENT ILLNESS:  UPDATE 11/13/15: Since last visit, had a fall in June 2017 (bumped into spouse in kitchen), then right hip fracture. Had surgical repair. Otherwise stable. Still with dizziness.  UPDATE 04/18/15: Since last visit, stable. No more syncope. No falls. Gait stable. Carb/levo working well, and stable. No wearing off.   UPDATE 09/19/14: Since last visit, she had episode of passing out at home in July 08, 2014; was standing in kitchen, then passed out. Hit head on concrete slab flooring. LOC was witnessed by husband. Husband helped her sit up, and patient gradually woke up. She tried drinking OJ, but spit it out and vomited. They checked sugar, and it was 123. Went to urgent care on 07/10/14. They check MRI brain, and dx'd sinusitis and treated with abx. Otherwise PD symptoms are stable. Now on carb/levo 2 tabs TID.  UPDATE 03/14/14: Since last visit, right hand shaking more. Still struggles with back pain. No falls. Some balance issues. On carb/levo 25/100 (1.5 tabs BID; sometimes takes extra meds in mid day if has more tremor). More slowness/stiffness in early AM. Also notes more intermittent cold intolerance randomly through the day.  UPDATE 09/12/13 (LL): Since last visit, patient has started back into therapy and her depression is better under control.  Parkinson's disease symptoms are stable.  She has chronic constipation, only 1 BM/week. HH that was ordered at last visit did not occur, they never received any call. Carb/Levo is at same dose, misses mid-day dose occasionally. She has more back pain lately, she feels it is from compression fx that occurred in 2012.  UPDATE 05/25/13: Since last visit, multiple family tragedies:  nephew and mother passed away. This has led to deep depression. Patient continues to struggle on daily basis, with fatigue, depression, crying. Other parkinson's dz symptoms are stable. Still taking carb/levo, but misses mid-day dose many times.   UPDATE 03/14/13: Since last visit, doing well. Some wearing off symptoms noted when she wakes up, but not later in the day. Tolerating carb/levo 25/100, 1/5 tabs TID. No falls. She has some swallowing issues (feels food caught in mid-lower esophagus). Some nerve/anxiety issues, but manageable.   UPDATE 08/03/12: since last visit no new changes. Patient's tremor is under good control with her carbidopa/levodopa. Sometimes she forgets to take the midday dose. She takes her medications regularly, it seems to work well. If she misses a dose, she notices wearing off. Patient also reports mild short-term memory loss. No anxiety. Some constipation. I noted some oral-lingual dyskinesias today, which patient does not notice. Husband has noticed this. She attributes this to her ill fitting dentures.   UPDATE 03/08/12: Since last visit, doing better. Increased carb/levo seems to work better. Some off-time in early AM, otherise no waearing off. No side effects. No anxiety, constipation or falls. some swallow diff, but mild.   UPDATE 01/01/12: She feels like carbidopa-levodopa has helped 15%. She is tolerating carbidopa-levodopa well without dizziness, she has had occasional nausea. She takes her midday dose with her meals. Denies any falls. No change in smell and taste, bowel or bladder.   PRIOR HPI (Dr. [name]) 70 year old right-handed female with history of diabetes, hypertension, hyperlipidemia, here for evaluation of tremor  for the past one year. Patient noted intermittent tremor when she is doing certain actions such as handwriting, cooking, holding objects. Her husband has noted some intermittent resting tremor. Patient fell down in June 2012. She feels that her balance  and walking have slightly worsened over time. Husband has noticed change in her voice. Patient having a little bit more anxiety, as well as very vivid dreams lately. Patient has family history Parkinson's disease in her sister and her father. No change in sense of smell or taste.    REVIEW OF SYSTEMS: Full 14 system review of systems performed and negative except as per HPI.   ALLERGIES: No Known Allergies  HOME MEDICATIONS: Outpatient Medications Prior to Visit  Medication Sig Dispense Refill  . acetaminophen (TYLENOL) 500 MG tablet Take 500 mg by mouth every 6 (six) hours as needed.    . ADACEL 07-30-13.5 LF-MCG/0.5 injection Reported on 10/05/2015    . aluminum-magnesium hydroxide-simethicone (MAALOX) 200-200-20 MG/5ML SUSP Take 30 mLs by mouth 4 (four) times daily as needed.    Marland Kitchen. aspirin 81 MG tablet Take 81 mg by mouth daily.      Marland Kitchen. atorvastatin (LIPITOR) 20 MG tablet Take 20 mg by mouth daily.    . bisacodyl (DULCOLAX) 10 MG suppository Place 10 mg rectally daily as needed for moderate constipation.    . bisacodyl (DULCOLAX) 5 MG EC tablet Take 10 mg by mouth daily as needed for moderate constipation.    Marland Kitchen. buPROPion (WELLBUTRIN XL) 150 MG 24 hr tablet TAKE 1 TABLET BY MOUTH EVERY DAY 30 tablet 11  . Calcium Carbonate-Vitamin D 600-400 MG-UNIT tablet Take 1 tablet by mouth daily.    . carbidopa-levodopa (SINEMET IR) 25-100 MG tablet Take 2 tablets by mouth 3 (three) times daily. 540 tablet 4  . cyclobenzaprine (FLEXERIL) 5 MG tablet Take 5 mg by mouth 3 (three) times daily as needed for muscle spasms.    Marland Kitchen. docusate sodium (COLACE) 100 MG capsule Take 100 mg by mouth daily.    . fish oil-omega-3 fatty acids 1000 MG capsule Take 1 g by mouth daily.    . furosemide (LASIX) 20 MG tablet Take 20 mg by mouth daily.    . furosemide (LASIX) 40 MG tablet TAKE 1 TABLET BY MOUTH EVERY DAY 90 tablet 1  . glucose blood (ONE TOUCH ULTRA TEST) test strip 1 each by Other route daily. Use to check blood  sugars daily E11.8 (Patient not taking: Reported on 10/05/2015) 100 each 11  . ipratropium (ATROVENT) 0.03 % nasal spray Place 2 sprays into both nostrils 2 (two) times daily. 30 mL 0  . Lancets (ONETOUCH ULTRASOFT) lancets Use as instructed (Patient not taking: Reported on 10/05/2015) 100 each 12  . metFORMIN (GLUCOPHAGE) 1000 MG tablet TAKE 1 TABLET(1000 MG) BY MOUTH TWICE DAILY 60 tablet 0  . Multiple Vitamin (MULTIVITAMIN) tablet Take 1 tablet by mouth daily.    . traMADol (ULTRAM) 50 MG tablet Take 50 mg by mouth every 6 (six) hours as needed for moderate pain.    . traZODone (DESYREL) 50 MG tablet TAKE 1 TABLET BY MOUTH AT BEDTIME 30 tablet 2  . UNABLE TO FIND Med Name: Med pass 120 ml 2 times daily    . aspirin 325 MG EC tablet Take 325 mg by mouth 2 (two) times daily. Stop date 10/09/15    . cholecalciferol (VITAMIN D) 1000 UNITS tablet Take 1,000 Units by mouth daily. D3    . metFORMIN (GLUCOPHAGE) 1000 MG tablet Take  1,000 mg by mouth 2 (two) times daily with a meal.     No facility-administered medications prior to visit.      PHYSICAL EXAM  Vitals:   11/13/15 1333  BP: 129/78  Pulse: 79  Weight: 145 lb 3.2 oz (65.9 kg)   Body mass index is 24.92 kg/m. No exam data present  Neurologic Exam  Mental Status: Awake, alert. Language is fluent and comprehension intact. NEG MYERSONS. NEG SNOUT. MASKED FACIES. Cranial Nerves: Pupils are equal and reactive to light. Visual fields are full to confrontation. Conjugate eye movements are full and symmetric; EXCEPT FOR RIGHT EXO/HYPERTROPIA. Facial sensation and strength are symmetric. Hearing is intact. Palate elevated symmetrically and uvula is midline. Shoulder shrug is symmetric. Tongue is midline.  Motor: Normal bulk. MILD ACTION/POSTURAL. NO REST TREMOR. BRADYKINESIA IN LUE>RUE. COGWHEELING IN LUE. Full strength in the upper and lower extremities. No pronator drift.  Sensory: Intact and symmetric to light touch and temperature    Coordination: No ataxia or dysmetria on finger-nose or rapid alternating movement testing.  Reflexes: Deep tendon reflexes in the upper and lower extremity are present and symmetric; TRACE AT ANKLES. Gait and Station: STOOPED POSTURE. DECR ARM SWING. UNSTEADY, SLOW GAIT.    DIAGNOSTIC DATA (LABS, IMAGING, TESTING) - I reviewed patient records, labs, notes, testing and imaging myself where available.  Lab Results  Component Value Date   HGBA1C 6.3 02/13/2015   Lab Results  Component Value Date   VITAMINB12 546 10/21/2011   Lab Results  Component Value Date   TSH 0.87 09/12/2014    11/19/11 MRI of brain - multiple round and ovoid, periventricular, subcortical, thalamic and pontine foci of T2 hyperintensity likely representative of chronic small vessel ischemic disease.    ASSESSMENT AND PLAN  70 y.o. 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 She has 4 of the 4 cardinal features for Parkinson's disease. On carb/levo 25/100 (2 tabs TID). Unfortunately had fall and right hip fracture in June 2017.   Dx: idiopathic parkinson's disease + syncope event (07/08/14)  Parkinson's disease (HCC) - Plan: For home use only DME 4 wheeled rolling walker with seat  Gait difficulty - Plan: For home use only DME 4 wheeled rolling walker with seat  Neurogenic orthostatic hypotension (HCC)    PLAN:  1. Continue carbidopa-levodopa 2 tabs TID 2. Monitor BP for hypotension 3. Use rollator walker for fall risk reduction  Orders Placed This Encounter  Procedures  . For home use only DME 4 wheeled rolling walker with seat   Meds ordered this encounter  Medications  . carbidopa-levodopa (SINEMET IR) 25-100 MG tablet    Sig: Take 2 tablets by mouth 3 (three) times daily.    Dispense:  540 tablet    Refill:  4   Return in about 6 months (around 05/15/2016).    Suanne Marker, MD 11/13/2015,  1:44 PM Certified in Neurology, Neurophysiology and Neuroimaging  [facility] [address], [address] [place], [place] [address] [phone]

## 2015-11-13 NOTE — Progress Notes (Signed)
Received call from husband with updated medication list. Patient's MAR corrected.

## 2015-11-13 NOTE — Patient Instructions (Signed)
-   continue carbidopa/levodopa  - monitor BP at home  - use rollator walker (with bench and brakes)

## 2015-11-14 ENCOUNTER — Other Ambulatory Visit: Payer: Self-pay | Admitting: Internal Medicine

## 2015-11-16 ENCOUNTER — Other Ambulatory Visit: Payer: Self-pay | Admitting: Internal Medicine

## 2015-11-18 ENCOUNTER — Other Ambulatory Visit: Payer: Self-pay | Admitting: Internal Medicine

## 2015-11-22 ENCOUNTER — Telehealth: Payer: Self-pay

## 2015-11-22 NOTE — Telephone Encounter (Signed)
Received this message from Insurance co via CoverMyMeds: This medication is on your plan's list of covered drugs. Prior authorization is not required at this time  Pharmacy advised of same via VM

## 2015-11-22 NOTE — Telephone Encounter (Signed)
PA initiated via CoverMyMeds key [id number]

## 2016-01-04 ENCOUNTER — Other Ambulatory Visit: Payer: Self-pay | Admitting: Internal Medicine

## 2016-01-21 ENCOUNTER — Other Ambulatory Visit: Payer: Self-pay | Admitting: Internal Medicine

## 2016-02-03 IMAGING — CR DG WRIST COMPLETE 3+V*L*
4 series · 4 of 4 positions shown · non-contrast
Comparison: Hand films of 07/10/2014, dictated separately.

CLINICAL DATA: Initial encounter for left wrist injury from a fall
3 days ago.

EXAM:
LEFT WRIST - COMPLETE 3+ VIEW

[PA]
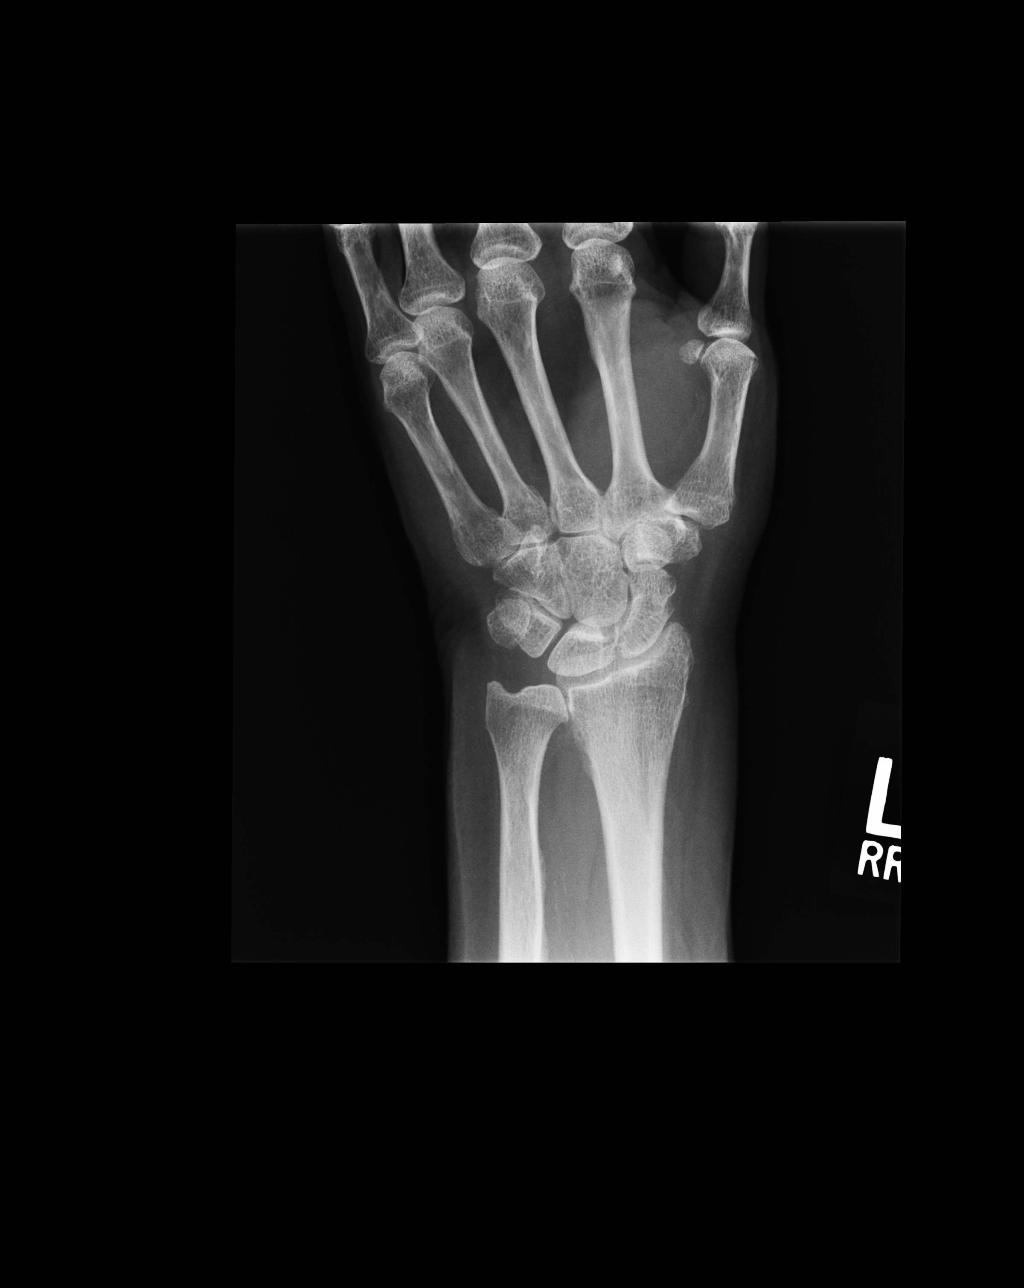

[pa int rot]
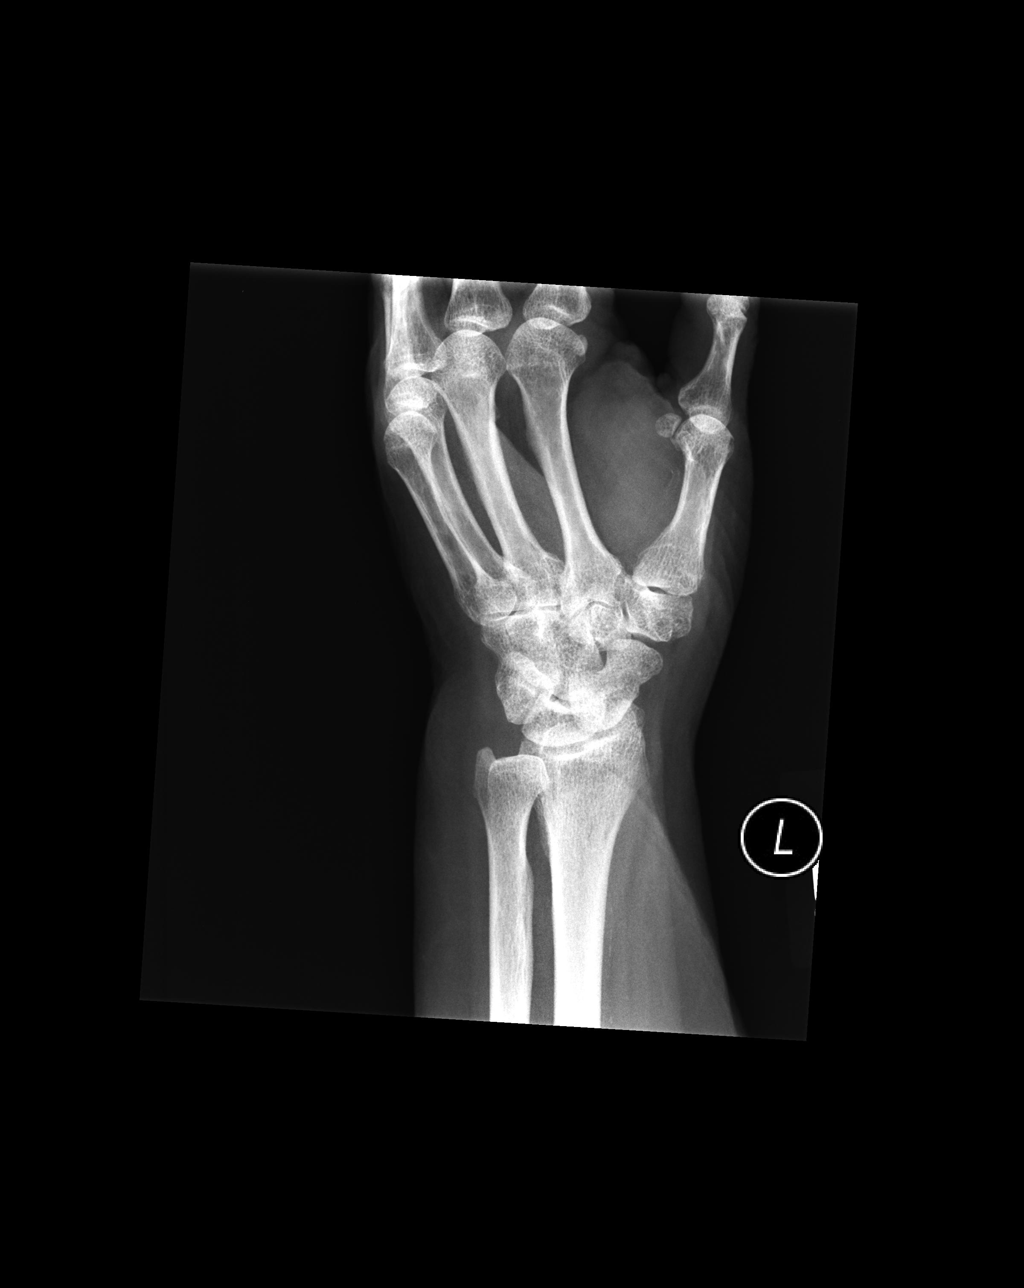

[lateral]
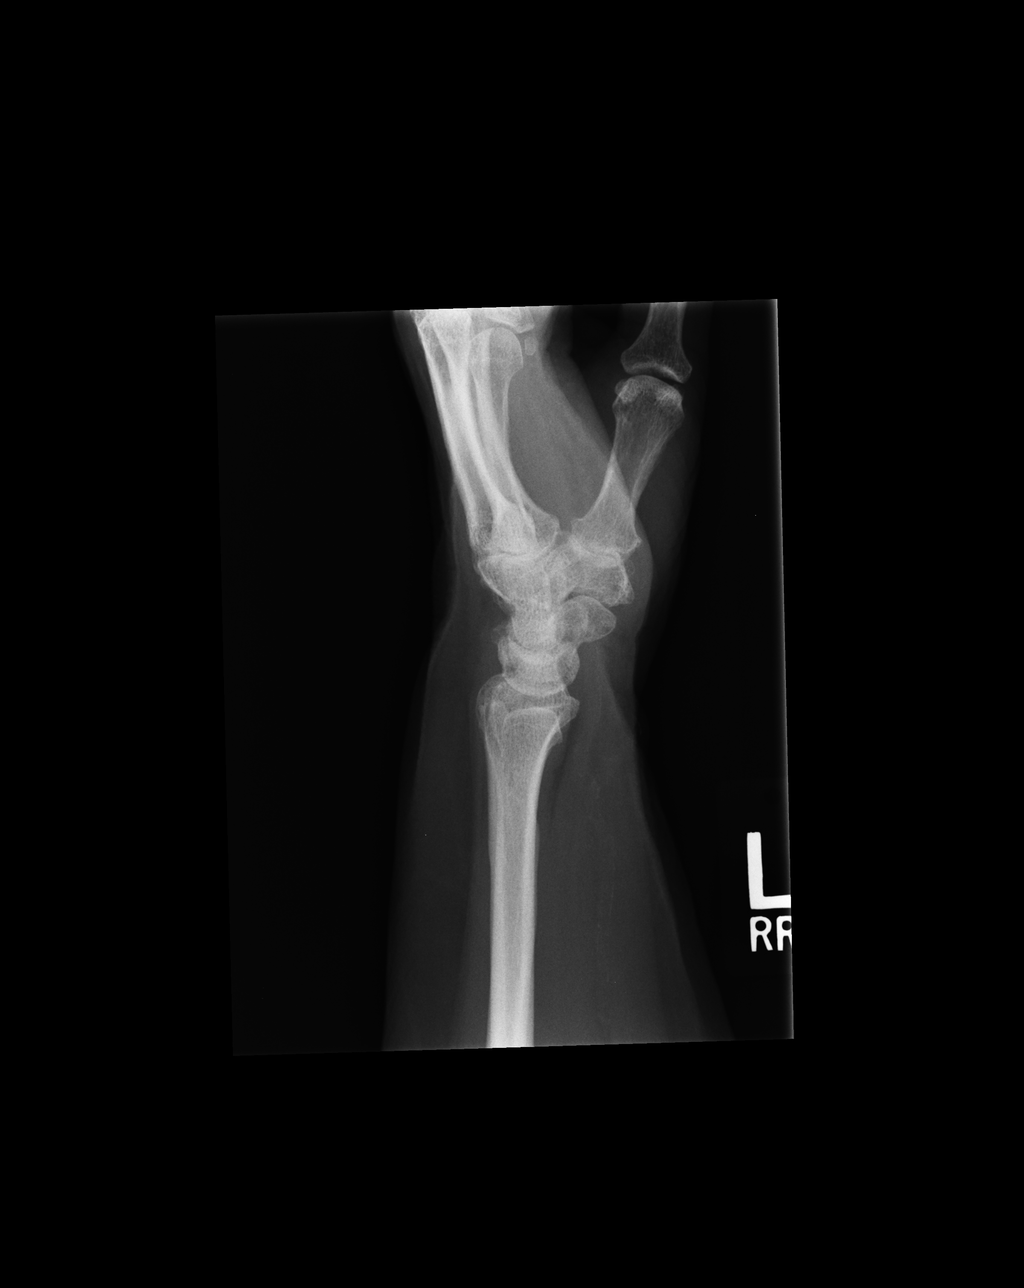

[ap ext rot]
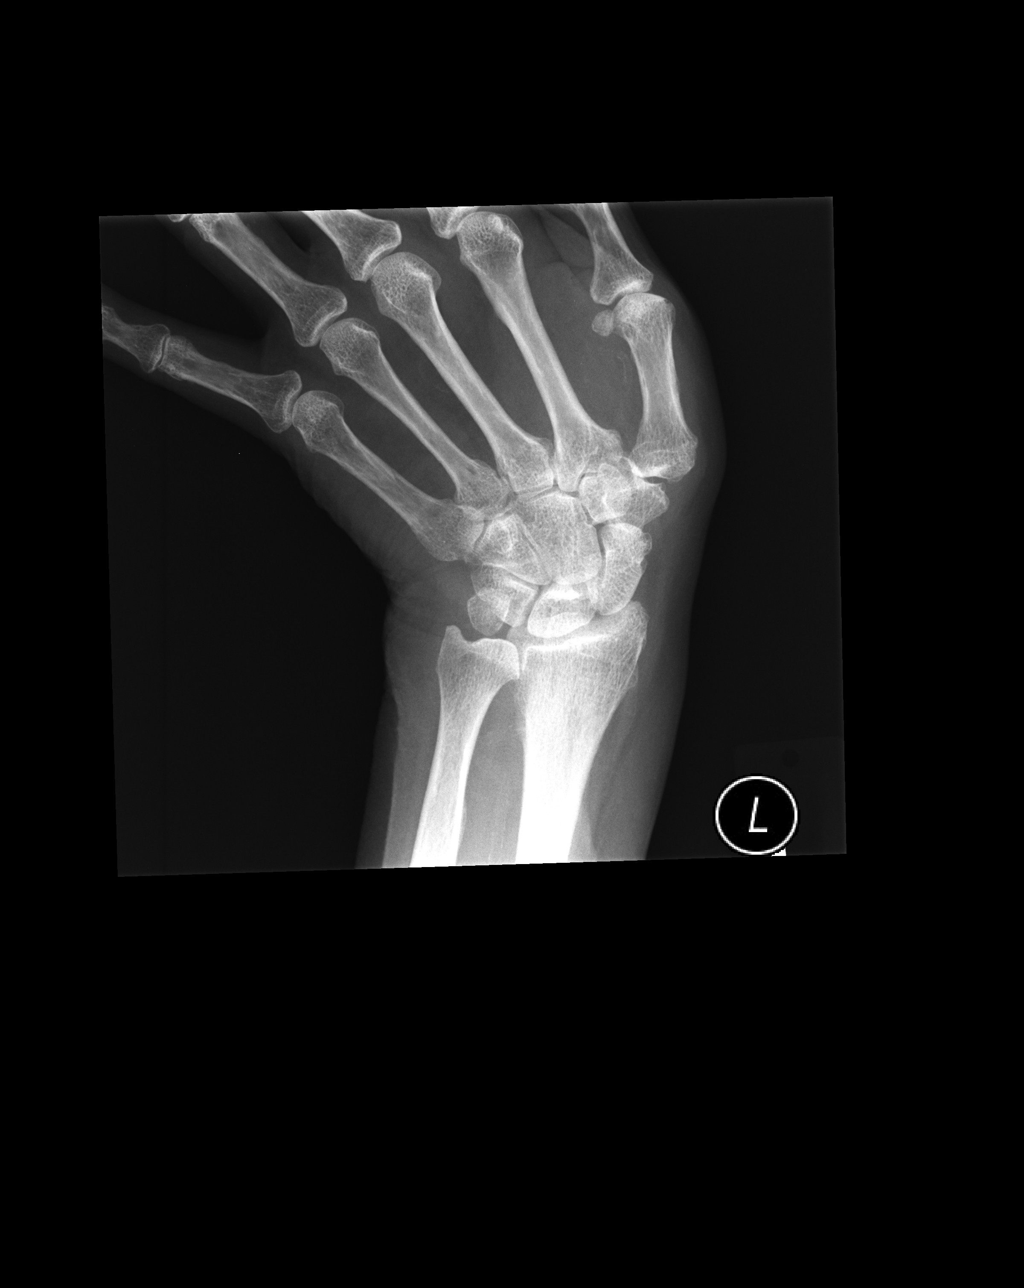

[4 of 4 positions shown; findings below may reference images not displayed]

FINDINGS: Radiocarpal osteoarthritis with joint space narrowing and
subchondral sclerosis. No acute fracture or dislocation. Scaphoid
intact.
IMPRESSION: Degenerative change, without acute osseous finding.

## 2016-02-16 ENCOUNTER — Other Ambulatory Visit: Payer: Self-pay | Admitting: Internal Medicine

## 2016-05-03 ENCOUNTER — Other Ambulatory Visit: Payer: Self-pay | Admitting: Internal Medicine

## 2016-05-19 ENCOUNTER — Ambulatory Visit: Payer: Medicare Other | Admitting: Diagnostic Neuroimaging

## 2016-05-21 ENCOUNTER — Ambulatory Visit (INDEPENDENT_AMBULATORY_CARE_PROVIDER_SITE_OTHER): Payer: Medicare Other | Admitting: Diagnostic Neuroimaging

## 2016-05-21 ENCOUNTER — Encounter: Payer: Self-pay | Admitting: Diagnostic Neuroimaging

## 2016-05-21 VITALS — BP 109/68 | HR 75 | Wt 141.2 lb

## 2016-05-21 DIAGNOSIS — R269 Unspecified abnormalities of gait and mobility: Secondary | ICD-10-CM

## 2016-05-21 DIAGNOSIS — G903 Multi-system degeneration of the autonomic nervous system: Secondary | ICD-10-CM | POA: Diagnosis not present

## 2016-05-21 DIAGNOSIS — G20 Parkinson's disease: Secondary | ICD-10-CM | POA: Diagnosis not present

## 2016-05-21 DIAGNOSIS — F329 Major depressive disorder, single episode, unspecified: Secondary | ICD-10-CM | POA: Diagnosis not present

## 2016-05-21 DIAGNOSIS — F32A Depression, unspecified: Secondary | ICD-10-CM

## 2016-05-21 MED ORDER — CARBIDOPA-LEVODOPA 25-100 MG PO TABS: 2.0000 | ORAL_TABLET | Freq: Three times a day (TID) | ORAL | 4 refills | Status: DC

## 2016-05-21 NOTE — Progress Notes (Signed)
PATIENT: Barbara Bradshaw DOB: [birth year]  REASON FOR VISIT: routine follow up for PD HISTORY FROM: patient, husband  Chief Complaint  Patient presents with  . Parkinson's disease    rm 7, Barbara Bradshaw, "my shaking is getting worse"  . Follow-up    6 month    HISTORY OF PRESENT ILLNESS:  UPDATE 05/21/16: Since last visit, tremors slightly worse. Some wearing off of meds 4-5 hours after doses. Currently taking at 8am, 2pm, 11pm. Wearing off at 1pm and 7pm.   UPDATE 11/13/15: Since last visit, had a fall in June 2017 (bumped into spouse in kitchen), then right hip fracture. Had surgical repair. Otherwise stable. Still with dizziness.  UPDATE 04/18/15: Since last visit, stable. No more syncope. No falls. Gait stable. Carb/levo working well, and stable. No wearing off.   UPDATE 09/19/14: Since last visit, she had episode of passing out at home in July 08, 2014; was standing in kitchen, then passed out. Hit head on concrete slab flooring. LOC was witnessed by husband. Husband helped her sit up, and patient gradually woke up. She tried drinking OJ, but spit it out and vomited. They checked sugar, and it was 123. Went to urgent care on 07/10/14. They check MRI brain, and dx'd sinusitis and treated with abx. Otherwise PD symptoms are stable. Now on carb/levo 2 tabs TID.  UPDATE 03/14/14: Since last visit, right hand shaking more. Still struggles with back pain. No falls. Some balance issues. On carb/levo 25/100 (1.5 tabs BID; sometimes takes extra meds in mid day if has more tremor). More slowness/stiffness in early AM. Also notes more intermittent cold intolerance randomly through the day.  UPDATE 09/12/13 (LL): Since last visit, patient has started back into therapy and her depression is better under control.  Parkinson's disease symptoms are stable.  She has chronic constipation, only 1 BM/week. HH that was ordered at last visit did not occur, they never received any call. Carb/Levo is at same  dose, misses mid-day dose occasionally. She has more back pain lately, she feels it is from compression fx that occurred in 2012.  UPDATE 05/25/13: Since last visit, multiple family tragedies: nephew and mother passed away. This has led to deep depression. Patient continues to struggle on daily basis, with fatigue, depression, crying. Other parkinson's dz symptoms are stable. Still taking carb/levo, but misses mid-day dose many times.   UPDATE 03/14/13: Since last visit, doing well. Some wearing off symptoms noted when she wakes up, but not later in the day. Tolerating carb/levo 25/100, 1/5 tabs TID. No falls. She has some swallowing issues (feels food caught in mid-lower esophagus). Some nerve/anxiety issues, but manageable.   UPDATE 08/03/12: since last visit no new changes. Patient's tremor is under good control with her carbidopa/levodopa. Sometimes she forgets to take the midday dose. She takes her medications regularly, it seems to work well. If she misses a dose, she notices wearing off. Patient also reports mild short-term memory loss. No anxiety. Some constipation. I noted some oral-lingual dyskinesias today, which patient does not notice. Husband has noticed this. She attributes this to her ill fitting dentures.   UPDATE 03/08/12: Since last visit, doing better. Increased carb/levo seems to work better. Some off-time in early AM, otherise no waearing off. No side effects. No anxiety, constipation or falls. some swallow diff, but mild.   UPDATE 01/01/12: She feels like carbidopa-levodopa has helped 15%. She is tolerating carbidopa-levodopa well without dizziness, she has had occasional nausea. She takes her midday dose  with her meals. Denies any falls. No change in smell and taste, bowel or bladder.   PRIOR HPI (Dr. [name]) 71 year old right-handed female with history of diabetes, hypertension, hyperlipidemia, here for evaluation of tremor for the past one year. Patient noted intermittent tremor  when she is doing certain actions such as handwriting, cooking, holding objects. Her husband has noted some intermittent resting tremor. Patient fell down in June 2012. She feels that her balance and walking have slightly worsened over time. Husband has noticed change in her voice. Patient having a little bit more anxiety, as well as very vivid dreams lately. Patient has family history Parkinson's disease in her sister and her father. No change in sense of smell or taste.   REVIEW OF SYSTEMS: Full 14 system review of systems performed and negative except: drooling walking diff anxiety.    ALLERGIES: No Known Allergies  HOME MEDICATIONS: Outpatient Medications Prior to Visit  Medication Sig Dispense Refill  . acetaminophen (TYLENOL) 500 MG tablet Take 500 mg by mouth every 6 (six) hours as needed.    Marland Kitchen. aspirin 81 MG tablet Take 81 mg by mouth daily.      Marland Kitchen. atorvastatin (LIPITOR) 20 MG tablet Take 20 mg by mouth daily.    Marland Kitchen. buPROPion (WELLBUTRIN XL) 150 MG 24 hr tablet TAKE 1 TABLET BY MOUTH EVERY DAY 30 tablet 11  . Calcium Carbonate-Vitamin D 600-400 MG-UNIT tablet Take 1 tablet by mouth daily.    . carbidopa-levodopa (SINEMET IR) 25-100 MG tablet Take 2 tablets by mouth 3 (three) times daily. 540 tablet 4  . cyclobenzaprine (FLEXERIL) 5 MG tablet Take 5 mg by mouth 3 (three) times daily as needed for muscle spasms.    Marland Kitchen. docusate sodium (COLACE) 100 MG capsule Take 100 mg by mouth daily.    . fish oil-omega-3 fatty acids 1000 MG capsule Take 1 g by mouth daily.    . furosemide (LASIX) 40 MG tablet TAKE 1 TABLET BY MOUTH EVERY DAY 90 tablet 1  . glucose blood (ONE TOUCH ULTRA TEST) test strip 1 each by Other route daily. Use to check blood sugars daily E11.8 100 each 11  . ipratropium (ATROVENT) 0.03 % nasal spray Place 2 sprays into both nostrils 2 (two) times daily. 30 mL 0  . Lancets (ONETOUCH ULTRASOFT) lancets Use as instructed 100 each 12  . metFORMIN (GLUCOPHAGE) 1000 MG tablet TAKE 1  TABLET(1000 MG) BY MOUTH TWICE DAILY 60 tablet 0  . Multiple Vitamin (MULTIVITAMIN) tablet Take 1 tablet by mouth daily.    . quinapril (ACCUPRIL) 10 MG tablet 10 mg daily.    . traZODone (DESYREL) 50 MG tablet TAKE 1 TABLET BY MOUTH AT BEDTIME 90 tablet 3   No facility-administered medications prior to visit.      PHYSICAL EXAM  Vitals:   05/21/16 1426  BP: 109/68  Pulse: 75  Weight: 141 lb 3.2 oz (64 kg)   Body mass index is 24.24 kg/m. No exam data present   Neurologic Exam  Mental Status: Awake, alert. Language is fluent and comprehension intact. NEG MYERSONS. NEG SNOUT. MASKED FACIES. Cranial Nerves: Pupils are equal and reactive to light. Visual fields are full to confrontation. Conjugate eye movements are full and symmetric; EXCEPT FOR RIGHT EXO/HYPERTROPIA. Facial sensation and strength are symmetric. Hearing is intact. Palate elevated symmetrically and uvula is midline. Shoulder shrug is symmetric. Tongue is midline.  Motor: Normal bulk. MILD ACTION/POSTURAL. NO REST TREMOR. BRADYKINESIA IN LUE > RUE. COGWHEELING IN LUE. Full strength  in the upper and lower extremities. No pronator drift.  Sensory: Intact and symmetric to light touch and temperature  Coordination: No ataxia or dysmetria on finger-nose or rapid alternating movement testing.  Reflexes: Deep tendon reflexes in the upper and lower extremity are present and symmetric; TRACE AT ANKLES. Gait and Station: STOOPED POSTURE. DECR ARM SWING. UNSTEADY, SLOW GAIT. SLIGHT LIMP WITH RIGHT LEG; DOES BETTER WITH ROLLATOR WALKER    DIAGNOSTIC DATA (LABS, IMAGING, TESTING) - I reviewed patient records, labs, notes, testing and imaging myself where available.  Lab Results  Component Value Date   HGBA1C 6.3 02/13/2015   Lab Results  Component Value Date   VITAMINB12 546 10/21/2011   Lab Results  Component Value Date   TSH 0.87 09/12/2014    11/19/11 MRI of brain - multiple round and ovoid, periventricular,  subcortical, thalamic and pontine foci of T2 hyperintensity likely representative of chronic small vessel ischemic disease.    ASSESSMENT AND PLAN  71 y.o. 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 She has 4 of the 4 cardinal features for Parkinson's disease. On carb/levo 25/100 (2 tabs TID). Unfortunately had fall and right hip fracture in June 2017.   Dx: idiopathic parkinson's disease + syncope event (07/08/14)  Parkinson's disease (HCC)  Gait difficulty  Neurogenic orthostatic hypotension (HCC)  Depression, unspecified depression type    PLAN:  - continue carbidopa-levodopa 2 tabs three times per day; change timing to 8am, noon and 4pm - monitor BP for hypotension - use rollator walker for fall risk reduction - discussed DBS evaluation; could be a candidate in the future  Meds ordered this encounter  Medications  . carbidopa-levodopa (SINEMET IR) 25-100 MG tablet    Sig: Take 2 tablets by mouth 3 (three) times daily. (Take at 8am, noon, 4pm)    Dispense:  540 tablet    Refill:  4   Return in about 6 months (around 11/18/2016).    Suanne Marker, MD 05/21/2016, 2:41 PM Certified in Neurology, Neurophysiology and Neuroimaging  [facility] [address], [address] [place], [place] [address] [phone]

## 2016-09-02 ENCOUNTER — Other Ambulatory Visit: Payer: Self-pay | Admitting: Internal Medicine

## 2016-09-23 ENCOUNTER — Other Ambulatory Visit: Payer: Self-pay | Admitting: Internal Medicine

## 2016-10-28 ENCOUNTER — Other Ambulatory Visit: Payer: Self-pay | Admitting: Internal Medicine

## 2016-11-11 ENCOUNTER — Other Ambulatory Visit: Payer: Self-pay | Admitting: Internal Medicine

## 2016-11-18 ENCOUNTER — Other Ambulatory Visit: Payer: Self-pay | Admitting: Diagnostic Neuroimaging

## 2016-11-18 ENCOUNTER — Encounter: Payer: Self-pay | Admitting: Diagnostic Neuroimaging

## 2016-11-18 ENCOUNTER — Ambulatory Visit (INDEPENDENT_AMBULATORY_CARE_PROVIDER_SITE_OTHER): Payer: Medicare Other | Admitting: Diagnostic Neuroimaging

## 2016-11-18 VITALS — BP 120/72 | HR 65 | Wt 143.6 lb

## 2016-11-18 DIAGNOSIS — G903 Multi-system degeneration of the autonomic nervous system: Secondary | ICD-10-CM

## 2016-11-18 DIAGNOSIS — G20 Parkinson's disease: Secondary | ICD-10-CM | POA: Diagnosis not present

## 2016-11-18 DIAGNOSIS — R269 Unspecified abnormalities of gait and mobility: Secondary | ICD-10-CM | POA: Diagnosis not present

## 2016-11-18 MED ORDER — CARBIDOPA-LEVODOPA 25-100 MG PO TABS: 2.0000 | ORAL_TABLET | Freq: Three times a day (TID) | ORAL | 4 refills | Status: DC

## 2016-11-18 NOTE — Patient Instructions (Signed)
-   continue current medications  - gradually increase walking; use rollator walker

## 2016-11-18 NOTE — Progress Notes (Signed)
PATIENT: Barbara Bradshaw DOB: 1946-03-11  REASON FOR VISIT: routine follow up for PD HISTORY FROM: patient, husband  Chief Complaint  Patient presents with  . Parkinson's disease    rm 7, husband- [name], "Tremors getting worse, my balance is worse"  . Follow-up    6 month    HISTORY OF PRESENT ILLNESS:  UPDATE 11/18/16: Since last visit, feels well. She changed her carb/levo to 8am, 4pm, 11pm (even through I had changed it to 8a, noon, 4p).Patient feels tremors are well controlled. No wearing off. Balance still poor. She declines to use walker.   UPDATE 05/21/16: Since last visit, tremors slightly worse. Some wearing off of meds 4-5 hours after doses. Currently taking at 8am, 2pm, 11pm. Wearing off at 1pm and 7pm.   UPDATE 11/13/15: Since last visit, had a fall in June 2017 (bumped into spouse in kitchen), then right hip fracture. Had surgical repair. Otherwise stable. Still with dizziness.  UPDATE 04/18/15: Since last visit, stable. No more syncope. No falls. Gait stable. Carb/levo working well, and stable. No wearing off.   UPDATE 09/19/14: Since last visit, she had episode of passing out at home in July 08, 2014; was standing in kitchen, then passed out. Hit head on concrete slab flooring. LOC was witnessed by husband. Husband helped her sit up, and patient gradually woke up. She tried drinking OJ, but spit it out and vomited. They checked sugar, and it was 123. Went to urgent care on 07/10/14. They check MRI brain, and dx'd sinusitis and treated with abx. Otherwise PD symptoms are stable. Now on carb/levo 2 tabs TID.  UPDATE 03/14/14: Since last visit, right hand shaking more. Still struggles with back pain. No falls. Some balance issues. On carb/levo 25/100 (1.5 tabs BID; sometimes takes extra meds in mid day if has more tremor). More slowness/stiffness in early AM. Also notes more intermittent cold intolerance randomly through the day.  UPDATE 09/12/13 (LL): Since last visit, patient  has started back into therapy and her depression is better under control.  Parkinson's disease symptoms are stable.  She has chronic constipation, only 1 BM/week. HH that was ordered at last visit did not occur, they never received any call. Carb/Levo is at same dose, misses mid-day dose occasionally. She has more back pain lately, she feels it is from compression fx that occurred in 2012.  UPDATE 05/25/13: Since last visit, multiple family tragedies: nephew and mother passed away. This has led to deep depression. Patient continues to struggle on daily basis, with fatigue, depression, crying. Other parkinson's dz symptoms are stable. Still taking carb/levo, but misses mid-day dose many times.   UPDATE 03/14/13: Since last visit, doing well. Some wearing off symptoms noted when she wakes up, but not later in the day. Tolerating carb/levo 25/100, 1/5 tabs TID. No falls. She has some swallowing issues (feels food caught in mid-lower esophagus). Some nerve/anxiety issues, but manageable.   UPDATE 08/03/12: since last visit no new changes. Patient's tremor is under good control with her carbidopa/levodopa. Sometimes she forgets to take the midday dose. She takes her medications regularly, it seems to work well. If she misses a dose, she notices wearing off. Patient also reports mild short-term memory loss. No anxiety. Some constipation. I noted some oral-lingual dyskinesias today, which patient does not notice. Husband has noticed this. She attributes this to her ill fitting dentures.   UPDATE 03/08/12: Since last visit, doing better. Increased carb/levo seems to work better. Some off-time in early AM,  otherise no waearing off. No side effects. No anxiety, constipation or falls. some swallow diff, but mild.   UPDATE 01/01/12: She feels like carbidopa-levodopa has helped 15%. She is tolerating carbidopa-levodopa well without dizziness, she has had occasional nausea. She takes her midday dose with her meals. Denies  any falls. No change in smell and taste, bowel or bladder.   PRIOR HPI (Dr. Marjory Lies) 71 year old right-handed female with history of diabetes, hypertension, hyperlipidemia, here for evaluation of tremor for the past one year. Patient noted intermittent tremor when she is doing certain actions such as handwriting, cooking, holding objects. Her husband has noted some intermittent resting tremor. Patient fell down in June 2012. She feels that her balance and walking have slightly worsened over time. Husband has noticed change in her voice. Patient having a little bit more anxiety, as well as very vivid dreams lately. Patient has family history Parkinson's disease in her sister and her father. No change in sense of smell or taste.   REVIEW OF SYSTEMS: Full 14 system review of systems performed and negative except: tremors speech diff anxiety drooling pain.    ALLERGIES: No Known Allergies  HOME MEDICATIONS: Outpatient Medications Prior to Visit  Medication Sig Dispense Refill  . acetaminophen (TYLENOL) 500 MG tablet Take 500 mg by mouth every 6 (six) hours as needed.    Marland Kitchen aspirin 81 MG tablet Take 81 mg by mouth daily.      Marland Kitchen atorvastatin (LIPITOR) 20 MG tablet Take 20 mg by mouth daily.    Marland Kitchen buPROPion (WELLBUTRIN XL) 150 MG 24 hr tablet TAKE 1 TABLET BY MOUTH EVERY DAY 30 tablet 11  . carbidopa-levodopa (SINEMET IR) 25-100 MG tablet Take 2 tablets by mouth 3 (three) times daily. (Take at 8am, noon, 4pm) 540 tablet 4  . cyclobenzaprine (FLEXERIL) 5 MG tablet Take 5 mg by mouth 3 (three) times daily as needed for muscle spasms.    . fish oil-omega-3 fatty acids 1000 MG capsule Take 1 g by mouth daily.    . furosemide (LASIX) 40 MG tablet TAKE 1 TABLET BY MOUTH EVERY DAY 90 tablet 1  . glucose blood (ONE TOUCH ULTRA TEST) test strip 1 each by Other route daily. Use to check blood sugars daily E11.8 100 each 11  . ipratropium (ATROVENT) 0.03 % nasal spray Place 2 sprays into both nostrils 2 (two)  times daily. 30 mL 0  . Lancets (ONETOUCH ULTRASOFT) lancets Use as instructed 100 each 12  . metFORMIN (GLUCOPHAGE) 1000 MG tablet TAKE 1 TABLET(1000 MG) BY MOUTH TWICE DAILY 60 tablet 0  . Multiple Vitamin (MULTIVITAMIN) tablet Take 1 tablet by mouth daily.    . traZODone (DESYREL) 50 MG tablet TAKE 1 TABLET BY MOUTH AT BEDTIME 90 tablet 3  . vitamin C (ASCORBIC ACID) 500 MG tablet Take 500 mg by mouth. During flu season only    . Zinc Sulfate (ZINC 15 PO) Take by mouth. During flu season only    . Calcium Carbonate-Vitamin D 600-400 MG-UNIT tablet Take 1 tablet by mouth daily.    Marland Kitchen docusate sodium (COLACE) 100 MG capsule Take 100 mg by mouth daily.    . quinapril (ACCUPRIL) 10 MG tablet 10 mg daily.     No facility-administered medications prior to visit.      PHYSICAL EXAM  Vitals:   11/18/16 1358  BP: 120/72  Pulse: 65  Weight: 143 lb 9.6 oz (65.1 kg)   Body mass index is 24.65 kg/m. No exam data present  Neurologic Exam  Mental Status: Awake, alert. Language is fluent and comprehension intact. NEG MYERSONS. NEG SNOUT. MASKED FACIES. Cranial Nerves: Pupils are equal and reactive to light. Visual fields are full to confrontation. Conjugate eye movements are full and symmetric; EXCEPT FOR RIGHT EXO/HYPERTROPIA. Facial sensation and strength are symmetric. Hearing is intact. Palate elevated symmetrically and uvula is midline. Shoulder shrug is symmetric. Tongue is midline.  Motor: Normal bulk. MILD ACTION/POSTURAL. NO REST TREMOR. BRADYKINESIA IN LUE > RUE. COGWHEELING IN LUE. Full strength in the upper and lower extremities. No pronator drift.  Sensory: Intact and symmetric to light touch and temperature  Coordination: No ataxia or dysmetria on finger-nose or rapid alternating movement testing.  Reflexes: Deep tendon reflexes in the upper and lower extremity are present and symmetric; TRACE AT ANKLES. Gait and Station: STOOPED POSTURE. DECR ARM SWING. UNSTEADY, SLOW GAIT.  SLIGHT LIMP WITH RIGHT LEG    DIAGNOSTIC DATA (LABS, IMAGING, TESTING) - I reviewed patient records, labs, notes, testing and imaging myself where available.  Lab Results  Component Value Date   HGBA1C 6.3 02/13/2015   Lab Results  Component Value Date   VITAMINB12 546 10/21/2011   Lab Results  Component Value Date   TSH 0.87 09/12/2014    11/19/11 MRI of brain - multiple round and ovoid, periventricular, subcortical, thalamic and pontine foci of T2 hyperintensity likely representative of chronic small vessel ischemic disease.    ASSESSMENT AND PLAN  71 y.o. 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 She has 4 of the 4 cardinal features for Parkinson's disease. On carb/levo 25/100 (2 tabs TID). Unfortunately had fall and right hip fracture in June 2017.   Dx: idiopathic parkinson's disease + syncope event (07/08/14)  Parkinson's disease (HCC)  Gait difficulty  Neurogenic orthostatic hypotension (HCC)    PLAN:   PARKINSON'S DISEASE (established problem, stable) - continue carbidopa-levodopa 2 tabs three times per day; change timing to 8am, noon and 4pm - monitor BP for hypotension - use rollator walker for fall risk reduction - discussed DBS evaluation; could be a candidate in the future  Meds ordered this encounter  Medications  . carbidopa-levodopa (SINEMET IR) 25-100 MG tablet    Sig: Take 2 tablets by mouth 3 (three) times daily. (Take at 8am, noon, 4pm)    Dispense:  540 tablet    Refill:  4   Return in about 1 year (around 11/18/2017).    Suanne Marker, MD 11/18/2016, 2:27 PM Certified in Neurology, Neurophysiology and Neuroimaging  [facility] [address] [place], [place] [address] [phone]

## 2016-11-19 NOTE — Telephone Encounter (Signed)
Spoke with Barbara Bradshaw, pharmacy tech at [facility] and advised her the refill request this RN received electronically today is incorrect. Advised her that patient got refills yesterday. Reviewed correct Rx withj Barbara Bradshaw. Barbara Bradshaw stated the refill request was automatically generated. She stated to disregard/refuse refill. This RN refused refill.  Patient has refill on file that Dr Marjory Lies sent yesterday.

## 2016-12-29 ENCOUNTER — Other Ambulatory Visit: Payer: Self-pay | Admitting: Diagnostic Neuroimaging

## 2017-01-08 ENCOUNTER — Other Ambulatory Visit: Payer: Self-pay | Admitting: Diagnostic Neuroimaging

## 2017-04-23 ENCOUNTER — Telehealth: Payer: Self-pay | Admitting: Diagnostic Neuroimaging

## 2017-04-23 NOTE — Telephone Encounter (Signed)
No problem from my standpoint. -VRP

## 2017-04-23 NOTE — Telephone Encounter (Signed)
Pt is calling letting us know that her orthopedic dr is wanting pt to get a shot for bone density ever 6 months. Pt is wanting to check with RN to make sure that would be fine.

## 2017-04-24 NOTE — Telephone Encounter (Signed)
Spoke to pt and relayed that getting injection for osteoporosis every 6 months was no problem.  She verbalized understanding.

## 2017-11-23 ENCOUNTER — Encounter: Payer: Self-pay | Admitting: Diagnostic Neuroimaging

## 2017-11-23 ENCOUNTER — Ambulatory Visit: Payer: Medicare Other | Admitting: Diagnostic Neuroimaging

## 2017-11-23 VITALS — BP 128/74 | HR 70 | Ht 64.0 in | Wt 150.6 lb

## 2017-11-23 DIAGNOSIS — G903 Multi-system degeneration of the autonomic nervous system: Secondary | ICD-10-CM | POA: Diagnosis not present

## 2017-11-23 DIAGNOSIS — R269 Unspecified abnormalities of gait and mobility: Secondary | ICD-10-CM | POA: Diagnosis not present

## 2017-11-23 DIAGNOSIS — G20 Parkinson's disease: Secondary | ICD-10-CM

## 2017-11-23 MED ORDER — CARBIDOPA-LEVODOPA 25-100 MG PO TABS: 2.0000 | ORAL_TABLET | Freq: Three times a day (TID) | ORAL | 4 refills | Status: DC

## 2017-11-23 NOTE — Progress Notes (Signed)
PATIENT: Barbara Bradshaw DOB: [birth year]  REASON FOR VISIT: routine follow up for PD HISTORY FROM: patient, husband  Chief Complaint  Patient presents with  . Parkinson's disease    rm 6, husband- [name]  "my tremors have gotten worse in both hands"  . Follow-up    one year    HISTORY OF PRESENT ILLNESS:  UPDATE (11/23/17, VRP): Since last visit, doing well. Symptoms are mild. No alleviating or aggravating factors. No falls. Tolerating carb/levo.   UPDATE 11/18/16: Since last visit, feels well. She changed her carb/levo to 8am, 4pm, 11pm (even through I had changed it to 8a, noon, 4p).Patient feels tremors are well controlled. No wearing off. Balance still poor. She declines to use walker.   UPDATE 05/21/16: Since last visit, tremors slightly worse. Some wearing off of meds 4-5 hours after doses. Currently taking at 8am, 2pm, 11pm. Wearing off at 1pm and 7pm.   UPDATE 11/13/15: Since last visit, had a fall in June 2017 (bumped into spouse in kitchen), then right hip fracture. Had surgical repair. Otherwise stable. Still with dizziness.  UPDATE 04/18/15: Since last visit, stable. No more syncope. No falls. Gait stable. Carb/levo working well, and stable. No wearing off.   UPDATE 09/19/14: Since last visit, she had episode of passing out at home in July 08, 2014; was standing in kitchen, then passed out. Hit head on concrete slab flooring. LOC was witnessed by husband. Husband helped her sit up, and patient gradually woke up. She tried drinking OJ, but spit it out and vomited. They checked sugar, and it was 123. Went to urgent care on 07/10/14. They check MRI brain, and dx'd sinusitis and treated with abx. Otherwise PD symptoms are stable. Now on carb/levo 2 tabs TID.  UPDATE 03/14/14: Since last visit, right hand shaking more. Still struggles with back pain. No falls. Some balance issues. On carb/levo 25/100 (1.5 tabs BID; sometimes takes extra meds in mid day if has more tremor). More  slowness/stiffness in early AM. Also notes more intermittent cold intolerance randomly through the day.  UPDATE 09/12/13 (LL): Since last visit, patient has started back into therapy and her depression is better under control.  Parkinson's disease symptoms are stable.  She has chronic constipation, only 1 BM/week. HH that was ordered at last visit did not occur, they never received any call. Carb/Levo is at same dose, misses mid-day dose occasionally. She has more back pain lately, she feels it is from compression fx that occurred in 2012.  UPDATE 05/25/13: Since last visit, multiple family tragedies: nephew and mother passed away. This has led to deep depression. Patient continues to struggle on daily basis, with fatigue, depression, crying. Other parkinson's dz symptoms are stable. Still taking carb/levo, but misses mid-day dose many times.   UPDATE 03/14/13: Since last visit, doing well. Some wearing off symptoms noted when she wakes up, but not later in the day. Tolerating carb/levo 25/100, 1/5 tabs TID. No falls. She has some swallowing issues (feels food caught in mid-lower esophagus). Some nerve/anxiety issues, but manageable.   UPDATE 08/03/12: since last visit no new changes. Patient's tremor is under good control with her carbidopa/levodopa. Sometimes she forgets to take the midday dose. She takes her medications regularly, it seems to work well. If she misses a dose, she notices wearing off. Patient also reports mild short-term memory loss. No anxiety. Some constipation. I noted some oral-lingual dyskinesias today, which patient does not notice. Husband has noticed this. She attributes this to  her ill fitting dentures.   UPDATE 03/08/12: Since last visit, doing better. Increased carb/levo seems to work better. Some off-time in early AM, otherise no waearing off. No side effects. No anxiety, constipation or falls. some swallow diff, but mild.   UPDATE 01/01/12: She feels like carbidopa-levodopa has  helped 15%. She is tolerating carbidopa-levodopa well without dizziness, she has had occasional nausea. She takes her midday dose with her meals. Denies any falls. No change in smell and taste, bowel or bladder.   PRIOR HPI (Dr. Marjory Lies) 72 year old right-handed female with history of diabetes, hypertension, hyperlipidemia, here for evaluation of tremor for the past one year. Patient noted intermittent tremor when she is doing certain actions such as handwriting, cooking, holding objects. Her husband has noted some intermittent resting tremor. Patient fell down in June 2012. She feels that her balance and walking have slightly worsened over time. Husband has noticed change in her voice. Patient having a little bit more anxiety, as well as very vivid dreams lately. Patient has family history Parkinson's disease in her sister and her father. No change in sense of smell or taste.   REVIEW OF SYSTEMS: Full 14 system review of systems performed and negative except: nervous constipation dizziness weakness eye itching drooling.     ALLERGIES: No Known Allergies  HOME MEDICATIONS: Outpatient Medications Prior to Visit  Medication Sig Dispense Refill  . acetaminophen (TYLENOL) 500 MG tablet Take 500 mg by mouth every 6 (six) hours as needed.    Marland Kitchen aspirin 81 MG tablet Take 81 mg by mouth daily.      Marland Kitchen atorvastatin (LIPITOR) 20 MG tablet Take 20 mg by mouth daily.    Marland Kitchen buPROPion (WELLBUTRIN XL) 300 MG 24 hr tablet 300 mg daily.  1  . Calcium Carbonate-Vitamin D 600-400 MG-UNIT tablet Take 1 tablet by mouth daily.    . carbidopa-levodopa (SINEMET IR) 25-100 MG tablet Take 2 tablets by mouth 3 (three) times daily. (Take at 8am, noon, 4pm) 540 tablet 4  . Cholecalciferol (VITAMIN D3) 1000 units CAPS Take by mouth.    . diclofenac sodium (VOLTAREN) 1 % GEL As needed    . docusate sodium (COLACE) 100 MG capsule Take 100 mg by mouth daily.    . fish oil-omega-3 fatty acids 1000 MG capsule Take 1 g by mouth  daily.    . furosemide (LASIX) 40 MG tablet TAKE 1 TABLET BY MOUTH EVERY DAY 90 tablet 1  . glucose blood (ONE TOUCH ULTRA TEST) test strip 1 each by Other route daily. Use to check blood sugars daily E11.8 100 each 11  . Lancets (ONETOUCH ULTRASOFT) lancets Use as instructed 100 each 12  . metFORMIN (GLUCOPHAGE) 1000 MG tablet TAKE 1 TABLET(1000 MG) BY MOUTH TWICE DAILY 60 tablet 0  . Multiple Vitamin (MULTIVITAMIN) tablet Take 1 tablet by mouth daily.    . naproxen sodium (ALEVE) 220 MG tablet Take 220 mg by mouth.    . quinapril (ACCUPRIL) 10 MG tablet 10 mg daily.    . traZODone (DESYREL) 50 MG tablet TAKE 1 TABLET BY MOUTH AT BEDTIME 90 tablet 3  . vitamin C (ASCORBIC ACID) 500 MG tablet Take 500 mg by mouth. During flu season only    . cyclobenzaprine (FLEXERIL) 5 MG tablet Take 5 mg by mouth 3 (three) times daily as needed for muscle spasms.    Marland Kitchen ipratropium (ATROVENT) 0.03 % nasal spray Place 2 sprays into both nostrils 2 (two) times daily. (Patient not taking: Reported on 11/23/2017)  30 mL 0  . Zinc Sulfate (ZINC 15 PO) Take by mouth. During flu season only    . buPROPion (WELLBUTRIN XL) 150 MG 24 hr tablet TAKE 1 TABLET BY MOUTH EVERY DAY 30 tablet 11   No facility-administered medications prior to visit.      PHYSICAL EXAM  GENERAL EXAM/CONSTITUTIONAL: Vitals:  Vitals:   11/23/17 1315  BP: 128/74  Pulse: 70  Weight: 150 lb 9.6 oz (68.3 kg)  Height: 5\' 4"  (1.626 m)     Body mass index is 25.85 kg/m. Wt Readings from Last 3 Encounters:  11/23/17 150 lb 9.6 oz (68.3 kg)  11/18/16 143 lb 9.6 oz (65.1 kg)  05/21/16 141 lb 3.2 oz (64 kg)     Patient is in no distress; well developed, nourished and groomed; neck is supple  CARDIOVASCULAR:  Examination of carotid arteries is normal; no carotid bruits  Regular rate and rhythm, no murmurs  Examination of peripheral vascular system by observation and palpation is normal  EYES:  Ophthalmoscopic exam of optic discs  and posterior segments is normal; no papilledema or hemorrhages  No exam data present  MUSCULOSKELETAL:  Gait, strength, tone, movements noted in Neurologic exam below  NEUROLOGIC: MENTAL STATUS:  No flowsheet data found.  awake, alert, oriented to person, place and time  recent and remote memory intact  normal attention and concentration  language fluent, comprehension intact, naming intact  fund of knowledge appropriate  CRANIAL NERVE:   2nd - no papilledema on fundoscopic exam  2nd, 3rd, 4th, 6th - pupils equal and reactive to light, visual fields full to confrontation, extraocular muscles intact, no nystagmus; EXCEPT FOR RIGHT EXO/HYPERTROPIA  5th - facial sensation symmetric  7th - facial strength symmetric  8th - hearing intact  9th - palate elevates symmetrically, uvula midline  11th - shoulder shrug symmetric  12th - tongue protrusion midline  MOTOR:   normal bulk and tone, full strength in the BUE, BLE; BRADYKINESIA IN LUE > RUE; BRADYKINESIA IN BLE; EXCEPT MILD ACTION/POSTURAL. NO REST TREMOR.  SENSORY:   normal and symmetric to light touch  COORDINATION:   finger-nose-finger, fine finger movements SLOW  REFLEXES:   deep tendon reflexes TRACE and symmetric  GAIT/STATION:   narrow based gait; USING 4 POINT CANE; SLOW UNSTEADY    DIAGNOSTIC DATA (LABS, IMAGING, TESTING) - I reviewed patient records, labs, notes, testing and imaging myself where available.  Lab Results  Component Value Date   HGBA1C 6.3 02/13/2015   Lab Results  Component Value Date   VITAMINB12 546 10/21/2011   Lab Results  Component Value Date   TSH 0.87 09/12/2014    11/19/11 MRI of brain - multiple round and ovoid, periventricular, subcortical, thalamic and pontine foci of T2 hyperintensity likely representative of chronic small vessel ischemic disease.    ASSESSMENT AND PLAN  72 y.o. 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 She has 4 of the 4 cardinal features for Parkinson's disease. On carb/levo 25/100 (2 tabs TID). Unfortunately had fall and right hip fracture in June 2017.   Dx: idiopathic parkinson's disease + syncope event (07/08/14)  Parkinson's disease (HCC)  Gait difficulty  Neurogenic orthostatic hypotension (HCC)    PLAN:   PARKINSON'S DISEASE  - stable; continue carb/levo 2 tabs three times a day - discussed DBS evaluation; could be a candidate in the future  DIZZINESS / LIGHTHEADEDNESS - monitor BP for hypotension  GAIT DIFFICULTY - use  rollator walker for fall risk reduction - discussed DBS evaluation; could be a candidate in the future  Meds ordered this encounter  Medications  . carbidopa-levodopa (SINEMET IR) 25-100 MG tablet    Sig: Take 2 tablets by mouth 3 (three) times daily. (Take at 8am, noon, 4pm)    Dispense:  540 tablet    Refill:  4   Return in about 1 year (around 11/24/2018).    Suanne Marker, MD 11/23/2017, 2:05 PM Certified in Neurology, Neurophysiology and Neuroimaging  [facility] [address] [place], [place] [address] [phone]

## 2017-12-26 ENCOUNTER — Other Ambulatory Visit: Payer: Self-pay | Admitting: Diagnostic Neuroimaging

## 2018-11-29 ENCOUNTER — Ambulatory Visit: Payer: Medicare Other | Admitting: Diagnostic Neuroimaging

## 2019-01-07 ENCOUNTER — Other Ambulatory Visit: Payer: Self-pay | Admitting: Diagnostic Neuroimaging

## 2019-03-03 ENCOUNTER — Other Ambulatory Visit: Payer: Self-pay | Admitting: Diagnostic Neuroimaging

## 2019-05-22 ENCOUNTER — Other Ambulatory Visit: Payer: Self-pay

## 2019-05-22 ENCOUNTER — Ambulatory Visit: Payer: Medicare Other | Attending: Internal Medicine

## 2019-05-22 DIAGNOSIS — Z23 Encounter for immunization: Secondary | ICD-10-CM | POA: Insufficient documentation

## 2019-05-22 NOTE — Progress Notes (Signed)
   Covid-19 Vaccination Clinic  Name:  Barbara Bradshaw    MRN: [id number] DOB: [birth year]  05/22/2019  Barbara Bradshaw was observed post Covid-19 immunization for 15 minutes without incidence. She was provided with Vaccine Information Sheet and instruction to access the V-Safe system.   Barbara Bradshaw was instructed to call 911 with any severe reactions post vaccine: Marland Kitchen Difficulty breathing  . Swelling of your face and throat  . A fast heartbeat  . A bad rash all over your body  . Dizziness and weakness    Immunizations Administered    Name Date Dose VIS Date Route   Pfizer COVID-19 Vaccine 05/22/2019  2:25 PM 0.3 mL 03/11/2019 Intramuscular   Manufacturer: [employer], [employer]   Lot: [id number]   NDC: [address]-9

## 2019-06-05 ENCOUNTER — Other Ambulatory Visit: Payer: Self-pay | Admitting: Diagnostic Neuroimaging

## 2019-06-14 ENCOUNTER — Ambulatory Visit: Payer: Medicare Other | Attending: Internal Medicine

## 2019-06-14 DIAGNOSIS — Z23 Encounter for immunization: Secondary | ICD-10-CM

## 2019-06-14 NOTE — Progress Notes (Signed)
   Covid-19 Vaccination Clinic  Name:  FUMIKO CHAM    MRN: [id number] DOB: 10/13/1945  06/14/2019  Ms. Skaff was observed post Covid-19 immunization for 15 minutes without incident. She was provided with Vaccine Information Sheet and instruction to access the V-Safe system.   Ms. Seward was instructed to call 911 with any severe reactions post vaccine: Marland Kitchen Difficulty breathing  . Swelling of face and throat  . A fast heartbeat  . A bad rash all over body  . Dizziness and weakness   Immunizations Administered    Name Date Dose VIS Date Route   Pfizer COVID-19 Vaccine 06/14/2019  2:12 PM 0.3 mL 03/11/2019 Intramuscular   Manufacturer: [employer], [employer]   Lot: [id number]   NDC: [address]-8

## 2021-06-26 ENCOUNTER — Encounter: Payer: Self-pay | Admitting: Internal Medicine

## 2023-04-03 ENCOUNTER — Other Ambulatory Visit: Payer: Self-pay

## 2023-04-03 ENCOUNTER — Emergency Department (HOSPITAL_COMMUNITY)
Admission: EM | Admit: 2023-04-03 | Discharge: 2023-04-06 | Disposition: A | Discharge status: Home / Self Care | Payer: Medicare Other | Attending: Emergency Medicine | Admitting: Emergency Medicine

## 2023-04-03 ENCOUNTER — Encounter (HOSPITAL_COMMUNITY): Payer: Self-pay | Admitting: Emergency Medicine

## 2023-04-03 DIAGNOSIS — T50904A Poisoning by unspecified drugs, medicaments and biological substances, undetermined, initial encounter: Secondary | ICD-10-CM | POA: Diagnosis present

## 2023-04-03 DIAGNOSIS — Z79899 Other long term (current) drug therapy: Secondary | ICD-10-CM | POA: Diagnosis not present

## 2023-04-03 DIAGNOSIS — T43214A Poisoning by selective serotonin and norepinephrine reuptake inhibitors, undetermined, initial encounter: Secondary | ICD-10-CM | POA: Insufficient documentation

## 2023-04-03 DIAGNOSIS — Z7984 Long term (current) use of oral hypoglycemic drugs: Secondary | ICD-10-CM | POA: Diagnosis not present

## 2023-04-03 DIAGNOSIS — F32A Depression, unspecified: Secondary | ICD-10-CM | POA: Diagnosis not present

## 2023-04-03 DIAGNOSIS — E119 Type 2 diabetes mellitus without complications: Secondary | ICD-10-CM | POA: Insufficient documentation

## 2023-04-03 DIAGNOSIS — I10 Essential (primary) hypertension: Secondary | ICD-10-CM | POA: Diagnosis not present

## 2023-04-03 DIAGNOSIS — Z7982 Long term (current) use of aspirin: Secondary | ICD-10-CM | POA: Diagnosis not present

## 2023-04-03 DIAGNOSIS — F0390 Unspecified dementia without behavioral disturbance: Secondary | ICD-10-CM | POA: Diagnosis not present

## 2023-04-03 DIAGNOSIS — Z91040 Latex allergy status: Secondary | ICD-10-CM | POA: Diagnosis not present

## 2023-04-03 DIAGNOSIS — F329 Major depressive disorder, single episode, unspecified: Secondary | ICD-10-CM | POA: Diagnosis not present

## 2023-04-03 DIAGNOSIS — G20A1 Parkinson's disease without dyskinesia, without mention of fluctuations: Secondary | ICD-10-CM | POA: Insufficient documentation

## 2023-04-03 DIAGNOSIS — T501X4A Poisoning by loop [high-ceiling] diuretics, undetermined, initial encounter: Secondary | ICD-10-CM | POA: Diagnosis not present

## 2023-04-03 DIAGNOSIS — Z20822 Contact with and (suspected) exposure to covid-19: Secondary | ICD-10-CM | POA: Insufficient documentation

## 2023-04-03 DIAGNOSIS — X838XXA Intentional self-harm by other specified means, initial encounter: Secondary | ICD-10-CM | POA: Insufficient documentation

## 2023-04-03 DIAGNOSIS — T1491XA Suicide attempt, initial encounter: Secondary | ICD-10-CM | POA: Diagnosis not present

## 2023-04-03 DIAGNOSIS — F332 Major depressive disorder, recurrent severe without psychotic features: Secondary | ICD-10-CM

## 2023-04-03 LAB — COMPREHENSIVE METABOLIC PANEL
ALT: 15 U/L (ref 0–44)
AST: 24 U/L (ref 15–41)
Albumin: 3.9 g/dL (ref 3.5–5.0)
Alkaline Phosphatase: 61 U/L (ref 38–126)
Anion gap: 14 (ref 5–15)
BUN: 37 mg/dL — ABNORMAL HIGH (ref 8–23)
CO2: 23 mmol/L (ref 22–32)
Calcium: 9.5 mg/dL (ref 8.9–10.3)
Chloride: 102 mmol/L (ref 98–111)
Creatinine, Ser: 1.16 mg/dL — ABNORMAL HIGH (ref 0.44–1.00)
GFR, Estimated: 48 mL/min — ABNORMAL LOW (ref >60)
Glucose, Bld: 191 mg/dL — ABNORMAL HIGH (ref 70–99)
Potassium: 4.3 mmol/L (ref 3.5–5.1)
Sodium: 139 mmol/L (ref 135–145)
Total Bilirubin: 0.6 mg/dL (ref 0.0–1.2)
Total Protein: 6.8 g/dL (ref 6.5–8.1)

## 2023-04-03 LAB — CBC WITH DIFFERENTIAL/PLATELET
Abs Immature Granulocytes: 0.03 10*3/uL (ref 0.00–0.07)
Basophils Absolute: 0 10*3/uL (ref 0.0–0.1)
Basophils Relative: 0 %
Eosinophils Absolute: 0.2 10*3/uL (ref 0.0–0.5)
Eosinophils Relative: 2 %
HCT: 41 % (ref 36.0–46.0)
Hemoglobin: 13.2 g/dL (ref 12.0–15.0)
Immature Granulocytes: 0 %
Lymphocytes Relative: 20 %
Lymphs Abs: 2 10*3/uL (ref 0.7–4.0)
MCH: 28.6 pg (ref 26.0–34.0)
MCHC: 32.2 g/dL (ref 30.0–36.0)
MCV: 88.7 fL (ref 80.0–100.0)
Monocytes Absolute: 0.7 10*3/uL (ref 0.1–1.0)
Monocytes Relative: 7 %
Neutro Abs: 7.4 10*3/uL (ref 1.7–7.7)
Neutrophils Relative %: 71 %
Platelets: 175 10*3/uL (ref 150–400)
RBC: 4.62 MIL/uL (ref 3.87–5.11)
RDW: 12.9 % (ref 11.5–15.5)
WBC: 10.4 10*3/uL (ref 4.0–10.5)
nRBC: 0 % (ref 0.0–0.2)

## 2023-04-03 LAB — SALICYLATE LEVEL: Salicylate Lvl: <7 mg/dL — ABNORMAL LOW (ref 7.0–30.0)

## 2023-04-03 LAB — ACETAMINOPHEN LEVEL: Acetaminophen (Tylenol), Serum: 16 ug/mL (ref 10–30)

## 2023-04-03 NOTE — ED Provider Notes (Signed)
 [place] EMERGENCY DEPARTMENT AT [facility] Provider Note   CSN: [id number] Arrival date & time: 04/03/23  1942     History  Chief Complaint  Patient presents with   Drug Overdose    Barbara Bradshaw is a 78 y.o. female.  HPI     This is a 78 year old female who presents with reported overdose.  Patient was brought in by EMS.  She reportedly lives with her husband.  Presumptively took as many as 4450 mg trazodone  and 4420 mg Lasix  at around 6:30 PM.  Per EMS she vomited x 1.  She is awake, alert, oriented x 3.  Reported baseline of dementia.  She reportedly was sitting by empty pill bottles and told her husband that she ingested these medications; however she does not endorse this to me.  She denies any pain.  She only states that she is cold.  Level 5 caveat  Home Medications Prior to Admission medications   Medication Sig Start Date End Date Taking? Authorizing Provider  acetaminophen  (TYLENOL ) 500 MG tablet Take 500 mg by mouth every 6 (six) hours as needed.    [provider]  aspirin 81 MG tablet Take 81 mg by mouth daily.      [provider]  atorvastatin  (LIPITOR) 20 MG tablet Take 20 mg by mouth daily.    [provider]  buPROPion  (WELLBUTRIN  XL) 300 MG 24 hr tablet 300 mg daily. 11/02/17   [provider]  Calcium  Carbonate-Vitamin D 600-400 MG-UNIT tablet Take 1 tablet by mouth daily.    [provider]  carbidopa -levodopa  (SINEMET  IR) 25-100 MG tablet TAKE 2 TABLETS BY MOUTH THREE TIMES DAILY.(TK AT 8AM, NOON, AND 4PM) 03/03/19   Penumalli, Vikram R, MD  Cholecalciferol (VITAMIN D3) 1000 units CAPS Take by mouth.    [provider]  cyclobenzaprine (FLEXERIL) 5 MG tablet Take 5 mg by mouth 3 (three) times daily as needed for muscle spasms.    [provider]  diclofenac sodium (VOLTAREN) 1 % GEL As needed 09/18/16   [provider]  docusate sodium (COLACE) 100 MG capsule Take 100 mg by  mouth daily.    [provider]  fish oil-omega-3 fatty acids 1000 MG capsule Take 1 g by mouth daily.    [provider]  furosemide  (LASIX ) 40 MG tablet TAKE 1 TABLET BY MOUTH EVERY DAY 05/03/16   Joshua Debby CROME, MD  glucose blood (ONE TOUCH ULTRA TEST) test strip 1 each by Other route daily. Use to check blood sugars daily E11.8 11/03/14   Joshua Debby CROME, MD  ipratropium (ATROVENT ) 0.03 % nasal spray Place 2 sprays into both nostrils 2 (two) times daily. Patient not taking: Reported on 11/23/2017 07/12/14   Gretta Ozell CROME, PA-C  Lancets [facility] ULTRASOFT) lancets Use as instructed 04/08/13   Norins, Michael E, MD  metFORMIN  (GLUCOPHAGE ) 1000 MG tablet TAKE 1 TABLET(1000 MG) BY MOUTH TWICE DAILY 10/29/15   Joshua Debby CROME, MD  Multiple Vitamin (MULTIVITAMIN) tablet Take 1 tablet by mouth daily.    [provider]  naproxen  sodium (ALEVE ) 220 MG tablet Take 220 mg by mouth.    [provider]  quinapril  (ACCUPRIL ) 10 MG tablet 10 mg daily. 08/21/15   [provider]  traZODone  (DESYREL ) 50 MG tablet TAKE 1 TABLET BY MOUTH AT BEDTIME 11/18/15   Joshua Debby CROME, MD  vitamin C (ASCORBIC ACID) 500 MG tablet Take 500 mg by mouth. During flu season only  [provider]  Zinc Sulfate (ZINC 15 PO) Take by mouth. During flu season only    [provider]      Allergies    Latex    Review of Systems   Review of Systems  Constitutional:  Negative for fever.  Respiratory:  Negative for shortness of breath.   Cardiovascular:  Negative for chest pain.  Gastrointestinal:  Positive for vomiting. Negative for abdominal pain.  All other systems reviewed and are negative.   Physical Exam Updated Vital Signs BP (!) 142/60   Pulse 88   Temp 97.7 F (36.5 C) (Oral)   Resp 13   SpO2 100%  Physical Exam Vitals and nursing note reviewed.  Constitutional:      Appearance: She is well-developed.     Comments: Elderly, nontoxic-appearing   HENT:     Head: Normocephalic and atraumatic.     Nose: Nose normal.     Mouth/Throat:     Mouth: Mucous membranes are moist.  Eyes:     Pupils: Pupils are equal, round, and reactive to light.     Comments: Pupils 2 mm and reactive bilaterally  Cardiovascular:     Rate and Rhythm: Normal rate and regular rhythm.     Heart sounds: Normal heart sounds.  Pulmonary:     Effort: Pulmonary effort is normal. No respiratory distress.     Breath sounds: No wheezing.  Abdominal:     Palpations: Abdomen is soft.     Tenderness: There is no abdominal tenderness.  Musculoskeletal:     Cervical back: Neck supple.     Right lower leg: No edema.     Left lower leg: No edema.  Skin:    General: Skin is warm and dry.  Neurological:     Mental Status: She is alert and oriented to person, place, and time.     ED Results / Procedures / Treatments   Labs (all labs ordered are listed, but only abnormal results are displayed) Labs Reviewed  COMPREHENSIVE METABOLIC PANEL - Abnormal; Notable for the following components:      Result Value   Glucose, Bld 191 (*)    BUN 37 (*)    Creatinine, Ser 1.16 (*)    GFR, Estimated 48 (*)    All other components within normal limits  SALICYLATE LEVEL - Abnormal; Notable for the following components:   Salicylate Lvl <7.0 (*)    All other components within normal limits  CBC WITH DIFFERENTIAL/PLATELET  ACETAMINOPHEN  LEVEL  URINALYSIS, ROUTINE W REFLEX MICROSCOPIC  RAPID URINE DRUG SCREEN, HOSP PERFORMED  BASIC METABOLIC PANEL    EKG EKG Interpretation Date/Time:  Friday April 03 2023 19:52:53 EST Ventricular Rate:  87 PR Interval:  182 QRS Duration:  143 QT Interval:  390 QTC Calculation: 470 R Axis:   16  Text Interpretation: Sinus rhythm Atrial premature complex Left bundle branch block Confirmed by Bari Pfeiffer ([id number]) on 04/03/2023 7:55:17 PM  Radiology No results found.  Procedures .Critical Care  Performed by: Bari Pfeiffer FALCON, MD Authorized by: Bari Pfeiffer FALCON, MD   Critical care provider statement:    Critical care time (minutes):  31   Critical care was necessary to treat or prevent imminent or life-threatening deterioration of the following conditions: Acute overdose.   Critical care was time spent personally by me on the following activities:  Development of treatment plan with patient or surrogate, discussions with consultants, evaluation of patient's response to treatment, examination of patient, ordering  and review of laboratory studies, ordering and review of radiographic studies, ordering and performing treatments and interventions, pulse oximetry, re-evaluation of patient's condition and review of old charts     Medications Ordered in ED Medications - No data to display  ED Course/ Medical Decision Making/ A&P Clinical Course as of 04/03/23 2311  [place] Apr 03, 2023  2259 Patient easily arousable.  Clinically stable.  Vital signs reassuring. [CH]  2307 Husband is now at the bedside.  Endorses that he believes that she tried overdose 2 nights ago.  He walked into the bathroom when she had all her medications and she stated you caught me.  He believes that she did in fact take these medications intentionally and reports that she had multiple episodes of emesis after he found her tonight.  He is unsure whether there were pills in the emesis. [CH]    Clinical Course User Index [CH] Guido Comp, Charmaine FALCON, MD                                 Medical Decision Making Amount and/or Complexity of Data Reviewed Labs: ordered.   This patient presents to the ED for concern of overdose, this involves an extensive number of treatment options, and is a complaint that carries with it a high risk of complications and morbidity.  I considered the following differential and admission for this acute, potentially life threatening condition.  The differential diagnosis includes and overdose, accidental overdose  MDM:     This is a 78 year old female who presents after reported intentional overdose.  She does not endorse that she took these medications although peripheral information would support this.  She is awake and alert at this time.  Per poison control they recommend EKG and repeat electrolytes at 6 hours.  She is monitored on the cardiac monitor.  Tylenol  and salicylate levels are negative.  Basic lab work is reassuring including metabolites.  (Labs, imaging, consults)  Labs: I Ordered, and personally interpreted labs.  The pertinent results include: CBC, CMP, salicylate and Tylenol  levels, UDS  Imaging Studies ordered: I ordered imaging studies including none I independently visualized and interpreted imaging. I agree with the radiologist interpretation  Additional history obtained from chart review.  External records from outside source obtained and reviewed including prior evaluations  Cardiac Monitoring: The patient was maintained on a cardiac monitor.  If on the cardiac monitor, I personally viewed and interpreted the cardiac monitored which showed an underlying rhythm of: Sinus  Reevaluation: After the interventions noted above, I reevaluated the patient and found that they have :stayed the same  Social Determinants of Health:  lives independently  Disposition: Pending  Co morbidities that complicate the patient evaluation  Past Medical History:  Diagnosis Date   Acute blood loss anemia    Bilateral edema of lower extremity    Diabetes mellitus, type 2 (HCC)    Hyperlipidemia    Hypertension    Insomnia    Intertrochanteric fracture of right hip (HCC)    Major depression, chronic    Nephrolithiasis    Parkinson's disease (HCC) 12/2011   Rosacea    Slow transit constipation    Thrombocytopenia (HCC)    Unsteady gait      Medicines No orders of the defined types were placed in this encounter.   I have reviewed the patients home medicines and have made adjustments as  needed  Problem List / ED  Course: Problem List Items Addressed This Visit   None Visit Diagnoses       Overdose of undetermined intent, initial encounter    -  Primary                   Final Clinical Impression(s) / ED Diagnoses Final diagnoses:  Overdose of undetermined intent, initial encounter    Rx / DC Orders ED Discharge Orders     None         Bari Charmaine FALCON, MD 04/03/23 2311

## 2023-04-03 NOTE — ED Triage Notes (Signed)
 BIB EMS from where she lives with husband. Pt reportedly took 44 trazadone 50 mg and 44 lasix 20 mg about 1830. Vomited once at home. Mentation at baseline with dementia.

## 2023-04-03 NOTE — ED Notes (Addendum)
 Per Patty at [employer]. ... Cardiac monitor for 6 hours and EKG repeated before clear. If QTC over 500, replace K to 4.2 and  mag to 2.2.  Watch for hypotension.  Fluids.  Repeat electrolytes 6 hours post ingestion and tylenol  level.  If she gets symptomatic may have to repeat electrolyte check 4 hours after that.

## 2023-04-04 DIAGNOSIS — F332 Major depressive disorder, recurrent severe without psychotic features: Secondary | ICD-10-CM

## 2023-04-04 DIAGNOSIS — F32A Depression, unspecified: Secondary | ICD-10-CM | POA: Diagnosis not present

## 2023-04-04 DIAGNOSIS — T1491XA Suicide attempt, initial encounter: Secondary | ICD-10-CM | POA: Diagnosis not present

## 2023-04-04 LAB — RAPID URINE DRUG SCREEN, HOSP PERFORMED
Amphetamines: NOT DETECTED
Barbiturates: NOT DETECTED
Benzodiazepines: NOT DETECTED
Cocaine: NOT DETECTED
Opiates: NOT DETECTED
Tetrahydrocannabinol: NOT DETECTED

## 2023-04-04 LAB — URINALYSIS, ROUTINE W REFLEX MICROSCOPIC
Bilirubin Urine: NEGATIVE
Glucose, UA: NEGATIVE mg/dL
Hgb urine dipstick: NEGATIVE
Ketones, ur: NEGATIVE mg/dL
Leukocytes,Ua: NEGATIVE
Nitrite: NEGATIVE
Protein, ur: NEGATIVE mg/dL
Specific Gravity, Urine: 1.009 (ref 1.005–1.030)
pH: 5 (ref 5.0–8.0)

## 2023-04-04 LAB — BASIC METABOLIC PANEL
Anion gap: 10 (ref 5–15)
BUN: 42 mg/dL — ABNORMAL HIGH (ref 8–23)
CO2: 23 mmol/L (ref 22–32)
Calcium: 8.3 mg/dL — ABNORMAL LOW (ref 8.9–10.3)
Chloride: 107 mmol/L (ref 98–111)
Creatinine, Ser: 1.14 mg/dL — ABNORMAL HIGH (ref 0.44–1.00)
GFR, Estimated: 49 mL/min — ABNORMAL LOW (ref >60)
Glucose, Bld: 110 mg/dL — ABNORMAL HIGH (ref 70–99)
Potassium: 3.9 mmol/L (ref 3.5–5.1)
Sodium: 140 mmol/L (ref 135–145)

## 2023-04-04 LAB — SARS CORONAVIRUS 2 BY RT PCR: SARS Coronavirus 2 by RT PCR: NEGATIVE

## 2023-04-04 MED ORDER — CARBIDOPA-LEVODOPA 25-100 MG PO TABS
2.0000 | ORAL_TABLET | Freq: Three times a day (TID) | ORAL | Status: DC
  Administered 2023-04-04 – 2023-04-06 (×7): 2 via ORAL
  Filled 2023-04-04 (×7): qty 2

## 2023-04-04 MED ORDER — METFORMIN HCL ER 500 MG PO TB24
500.0000 mg | ORAL_TABLET | Freq: Two times a day (BID) | ORAL | Status: DC
  Administered 2023-04-04 – 2023-04-06 (×5): 500 mg via ORAL
  Filled 2023-04-04 (×7): qty 1

## 2023-04-04 MED ORDER — SODIUM CHLORIDE 0.9 % IV BOLUS
500.0000 mL | Freq: Once | INTRAVENOUS | Status: AC
Start: 2023-04-04 — End: 2023-04-04
  Administered 2023-04-04: 500 mL via INTRAVENOUS

## 2023-04-04 MED ORDER — NAPROXEN 250 MG PO TABS: 250.0000 mg | ORAL_TABLET | Freq: Every day | ORAL | Status: DC | PRN

## 2023-04-04 MED ORDER — ATORVASTATIN CALCIUM 10 MG PO TABS
20.0000 mg | ORAL_TABLET | Freq: Every day | ORAL | Status: DC
  Administered 2023-04-04 – 2023-04-06 (×3): 20 mg via ORAL
  Filled 2023-04-04 (×3): qty 2

## 2023-04-04 MED ORDER — NAPROXEN SODIUM 220 MG PO TABS: 220.0000 mg | ORAL_TABLET | Freq: Every day | ORAL | Status: DC | PRN

## 2023-04-04 MED ORDER — FUROSEMIDE 20 MG PO TABS
40.0000 mg | ORAL_TABLET | Freq: Every day | ORAL | Status: DC
  Administered 2023-04-05 – 2023-04-06 (×2): 40 mg via ORAL
  Filled 2023-04-04 (×3): qty 2

## 2023-04-04 MED ORDER — MIRTAZAPINE 15 MG PO TABS
7.5000 mg | ORAL_TABLET | Freq: Every day | ORAL | Status: DC
  Administered 2023-04-04 – 2023-04-05 (×2): 7.5 mg via ORAL
  Filled 2023-04-04 (×2): qty 1

## 2023-04-04 NOTE — ED Notes (Signed)
 Pt assisted from stretcher to bedside commode via 2 person assist. Pt w/ shuffling gait, but tolerated well. Pt usually uses a walker at baseline. Pt A&Ox2 (self & time). Pt dementia at baseline. Pt then assisted from bedside to commode to bed.

## 2023-04-04 NOTE — ED Notes (Signed)
 Willet Schleifer Rn and Warehouse manager obtained urine with strait cath. Bladder emptied of 700 ml of fluid.

## 2023-04-04 NOTE — Consult Note (Signed)
 [facility] Psychiatric Consult Initial  Patient Name: .Barbara Bradshaw  MRN: [id number]  DOB: [birth year]  Consult Order details:  Orders (From admission, onward)     Start     Ordered   04/04/23 0604  CONSULT TO CALL ACT TEAM       Comments: overdose  Ordering Provider: Trine Raynell Moder, MD  Provider:  (Not yet assigned)  Question:  Reason for Consult?  Answer:  Psych consult   04/04/23 0603             Mode of Visit: In person    Psychiatry Consult Evaluation  Service Date: April 04, 2023 LOS:  LOS: 0 days  Chief Complaint suicide attempt  Primary Psychiatric Diagnoses  Suicide attempt 2.  MDD 3.    Assessment  Barbara Bradshaw is a 78 y.o. female admitted: Presented to the EDfor 04/03/2023  7:42 PM for suicide attempt. She carries the psychiatric diagnoses of MDD.  Her current presentation of depression is most consistent with MDD. She meets criteria for MDD based on suicide attempt, anhedonia, depressed mood, disturbed sleep and appetite.  Current outpatient psychotropic medications include trazodone  and historically she has had a positive response to these medications. She was  compliant with medications prior to admission as evidenced by patient report. On initial examination, patient is depressed, admits to suicide attempt by overdose of her trazodone  and lasix . Please see plan below for detailed recommendations.   Diagnoses:  Active Hospital problems: Principal Problem:   Suicide attempt [facility]) Active Problems:   MDD (major depressive disorder), recurrent episode, severe (HCC)    Plan   ## Psychiatric Medication Recommendations:  Continue home meds Add Remeron  7.5 mg Qhs  ## Medical Decision Making Capacity: Not specifically addressed in this encounter  ## Further Work-up:  TSH, B12, folate, EKG, U/A, or UDS -- most recent EKG on 04/03/23 had QtC of 481 -- Pertinent labwork reviewed earlier this admission includes:    ## Disposition:-- We recommend  inpatient psychiatric hospitalization when medically cleared. Patient is under voluntary admission status at this time; please IVC if attempts to leave hospital.  ## Behavioral / Environmental: -Delirium Precautions: Delirium Interventions for Nursing and Staff: - RN to open blinds every AM. - To Bedside: Glasses, hearing aide, and pt's own shoes. Make available to patients. when possible and encourage use. - Encourage po fluids when appropriate, keep fluids within reach. - OOB to chair with meals. - Passive ROM exercises to all extremities with AM & PM care. - RN to assess orientation to person, time and place QAM and PRN. - Recommend extended visitation hours with familiar family/friends as feasible. - Staff to minimize disturbances at night. Turn off television when pt asleep or when not in use.    ## Safety and Observation Level:  - Based on my clinical evaluation, I estimate the patient to be at high risk of self harm in the current setting. - At this time, we recommend  routine. This decision is based on my review of the chart including patient's history and current presentation, interview of the patient, mental status examination, and consideration of suicide risk including evaluating suicidal ideation, plan, intent, suicidal or self-harm behaviors, risk factors, and protective factors. This judgment is based on our ability to directly address suicide risk, implement suicide prevention strategies, and develop a safety plan while the patient is in the clinical setting. Please contact our team if there is a concern that risk level has changed.  CSSR Risk  Category:C-SSRS RISK CATEGORY: No Risk  Suicide Risk Assessment: Patient has following modifiable risk factors for suicide: under treated depression , which we are addressing by inpatient admission. Patient has following non-modifiable or demographic risk factors for suicide: psychiatric hospitalization Patient has the following protective factors  against suicide: Supportive family, Supportive friends, and Cultural, spiritual, or religious beliefs that discourage suicide  Thank you for this consult request. Recommendations have been communicated to the primary team.  We will recommend inpatient psychiatric treatment at this time.   Barbara Batter, NP       History of Present Illness  Relevant Aspects of Hospital ED Course:  Admitted on 04/03/2023 for suicide attempt.  This is a 78 year old female who presents with reported overdose. Patient was brought in by EMS. She reportedly lives with her husband. Presumptively took as many as 4450 mg trazodone  and 4420 mg Lasix  at around 6:30 PM. Per EMS she vomited x 1. She is awake, alert, oriented x 3. Reported baseline of dementia. She reportedly was sitting by empty pill bottles and told her husband that she ingested these medications; however she does not endorse this to me. She denies any pain. She only states that she is cold.   Pt medically cleared at 0600 by Dr. Trine. Briefly patient is a 78 y.o. female who presented with a history of mild dementia here for reported overdose on trazodone  and Lasix .  Patient is hemodynamically stable, alert and oriented x 3.  Labs relatively reassuring.  Plan to repeat labs at 2a, reassess for medical clearance and consult behavioral health   During stay, patient noted to have downtrending blood pressures.  Required IV fluid bolus.  Monitor for additional several hours.  Blood pressure stabilized with systolics in the 110s to 120s.  Repeat labs reassuring.   Now medically cleared for behavioral health evaluation  Patient Report:  Patient seen at [facility] ED for face-to-face psychiatric evaluation.  Upon evaluation patient was sleeping, but easy to wake.  She is alert and oriented x 4.  She is able to tell me her full name, date of birth, current location, month, and year.  Patient does appear slightly lethargic, she does not give me lengthy  responses.  She does admit to suicide attempt last night.  She was not able to articulate very well why she wanted to kill herself, but does appear tearful and states I just have been thinking about it a lot. I got really upset and I didn't see a point in living any more. I just took all the pills I could. I did want to die. Pt is unsure how she is feeling at this time. She shrugs her shoulders when I ask if she is still suicidal. She denies HI. Denies AVH. Pt states a long time ago she had been to a psych IP facility for depression. She denies previous suicide attempts. She is voluntary and agreeable with seeking inpatient treatment again. Pt is being reviewed at [facility] geropsych floor.   PT consult pending to assess mobility. Pt had surgery on her hip in April, reports she needs assistance with walker.   Psych ROS:  Depression: yes Anxiety:  yes Mania (lifetime and current): no Psychosis: (lifetime and current): no  Collateral information:  Atttempted to call her husband, who she lives with, but no response.   I spoke with her son, Matraca Hunkins, at [phone]. He was informed on plan for IP treatment and he was agreeable with plan. He expresses this was  a shock to the family and he wants to make sure she gets the help she needs.   Review of Systems  Psychiatric/Behavioral:  Positive for depression and suicidal ideas.      Psychiatric and Social History  Psychiatric History:  Information collected from chart review and pt  Prev Dx/Sx: MDD Current Psych Provider: none Home Meds (current): trazodone  Previous Med Trials: unknown Therapy: not currently  Prior Psych Hospitalization: decades ago Prior Self Harm: denies Prior Violence: denies  Family Psych History: unknown Family Hx suicide: unknown  Social History:  Developmental Hx: wdl Educational Hx: high school Occupational Hx: retired Armed Forces Operational Officer Hx: none Living Situation: lives with husband Spiritual Hx: yes Access to  weapons/lethal means: denies   Substance History Alcohol: denies  Tobacco: denies Illicit drugs: denies Prescription drug abuse: denies Rehab hx: denies  Exam Findings  Physical Exam:  Vital Signs:  Temp:  [97.7 F (36.5 C)-97.9 F (36.6 C)] 97.9 F (36.6 C) (01/04 0219) Pulse Rate:  [31-88] 64 (01/04 0530) Resp:  [13-22] 21 (01/04 0530) BP: (79-142)/(37-81) 113/56 (01/04 0530) SpO2:  [95 %-100 %] 97 % (01/04 0530) Blood pressure (!) 113/56, pulse 64, temperature 97.9 F (36.6 C), resp. rate (!) 21, SpO2 97%. There is no height or weight on file to calculate BMI.  Physical Exam Neurological:     Mental Status: She is alert and oriented to person, place, and time.     Mental Status Exam: General Appearance: Fairly Groomed  Orientation:  Full (Time, Place, and Person)  Memory:  Immediate;   Fair Recent;   Fair  Concentration:  Concentration: Fair  Recall:  Fair  Attention  Fair  Eye Contact:  Fair  Speech:  Clear and Coherent and Slow  Language:  Fair  Volume:  Decreased  Mood: I'm not sure  Affect:  Depressed  Thought Process:  Goal Directed  Thought Content:  WDL  Suicidal Thoughts:   unsure at this time  Homicidal Thoughts:  No  Judgement:  Fair  Insight:  Fair  Psychomotor Activity:  Normal  Akathisia:  No  Fund of Knowledge:  Fair      Assets:  Manufacturing Systems Engineer Desire for Improvement Housing Intimacy Leisure Time Resilience Social Support  Cognition:  WNL  ADL's:  Impaired  AIMS (if indicated):        Other History   These have been pulled in through the EMR, reviewed, and updated if appropriate.  Family History:  The patient's family history includes Cancer in her mother; Dementia in her mother; Diabetes in her father; Heart disease in her mother; Hypertension in her mother.  Medical History: Past Medical History:  Diagnosis Date  . Acute blood loss anemia   . Bilateral edema of lower extremity   . Diabetes mellitus, type 2 (HCC)    . Hyperlipidemia   . Hypertension   . Insomnia   . Intertrochanteric fracture of right hip (HCC)   . Major depression, chronic   . Nephrolithiasis   . Parkinson's disease (HCC) 12/2011  . Rosacea   . Slow transit constipation   . Thrombocytopenia (HCC)   . Unsteady gait     Surgical History: Past Surgical History:  Procedure Laterality Date  . right arm fx with ORIF    . TUBAL LIGATION       Medications:   Current Facility-Administered Medications:  .  atorvastatin  (LIPITOR) tablet 20 mg, 20 mg, Oral, Daily, Cardama, Raynell Moder, MD, 20 mg at 04/04/23 9062 .  carbidopa -levodopa  (SINEMET   IR) 25-100 MG per tablet immediate release 2 tablet, 2 tablet, Oral, TID, Cardama, Raynell Moder, MD, 2 tablet at 04/04/23 [phone] .  [START ON 04/05/2023] furosemide  (LASIX ) tablet 40 mg, 40 mg, Oral, Daily, Cardama, Raynell Moder, MD .  metFORMIN  (GLUCOPHAGE -XR) 24 hr tablet 500 mg, 500 mg, Oral, BID WC, Cardama, Pedro Eduardo, MD, 500 mg at 04/04/23 9062 .  naproxen  (NAPROSYN ) tablet 250 mg, 250 mg, Oral, Daily PRN, Cardama, Raynell Moder, MD  Current Outpatient Medications:  .  aspirin 81 MG tablet, Take 81 mg by mouth daily.  , Disp: , Rfl:  .  atorvastatin  (LIPITOR) 20 MG tablet, Take 20 mg by mouth daily., Disp: , Rfl:  .  Calcium  Carbonate-Vitamin D (CALCIUM -VITAMIN D PO), Take 1 tablet by mouth daily., Disp: , Rfl:  .  carbidopa -levodopa  (SINEMET  IR) 25-100 MG tablet, TAKE 2 TABLETS BY MOUTH THREE TIMES DAILY.(TK AT 8AM, NOON, AND 4PM), Disp: 540 tablet, Rfl: 0 .  Cholecalciferol (VITAMIN D3) 1000 units CAPS, Take by mouth., Disp: , Rfl:  .  Fish Oil-Krill Oil (KRILL OIL PLUS PO), Take 1 capsule by mouth daily., Disp: , Rfl:  .  furosemide  (LASIX ) 40 MG tablet, TAKE 1 TABLET BY MOUTH EVERY DAY, Disp: 90 tablet, Rfl: 1 .  metFORMIN  (GLUCOPHAGE -XR) 500 MG 24 hr tablet, Take 500 mg by mouth 2 (two) times daily with a meal., Disp: , Rfl:  .  Multiple Vitamin (MULTIVITAMIN) tablet, Take 1  tablet by mouth daily., Disp: , Rfl:  .  naproxen  sodium (ALEVE ) 220 MG tablet, Take 220 mg by mouth daily as needed (for pain)., Disp: , Rfl:  .  rosuvastatin  (CRESTOR ) 10 MG tablet, Take 1 tablet by mouth daily., Disp: , Rfl:  .  traZODone  (DESYREL ) 50 MG tablet, TAKE 1 TABLET BY MOUTH AT BEDTIME, Disp: 90 tablet, Rfl: 3 .  vitamin C (ASCORBIC ACID) 500 MG tablet, Take 500 mg by mouth. During flu season only, Disp: , Rfl:  .  VITAMIN E PO, Take 1 capsule by mouth daily., Disp: , Rfl:  .  Zinc Sulfate (ZINC 15 PO), Take by mouth. During flu season only, Disp: , Rfl:  .  glucose blood (ONE TOUCH ULTRA TEST) test strip, 1 each by Other route daily. Use to check blood sugars daily E11.8, Disp: 100 each, Rfl: 11 .  Lancets (ONETOUCH ULTRASOFT) lancets, Use as instructed, Disp: 100 each, Rfl: 12  Allergies: Allergies  Allergen Reactions  . Latex Rash    Barbara Batter, NP

## 2023-04-04 NOTE — ED Notes (Signed)
 Patient incont of urine patient cleaned and changed.

## 2023-04-04 NOTE — ED Notes (Signed)
 Pt changed into scrubs. Family took all belongings home. Pt has eyeglasses at bedside.

## 2023-04-04 NOTE — ED Notes (Signed)
Voluntary at this time per RN.

## 2023-04-04 NOTE — Evaluation (Signed)
 Physical Therapy Evaluation Patient Details Name: Barbara Bradshaw MRN: [id number] DOB: 12/04/1945 Today's Date: 04/04/2023  History of Present Illness  Pt is 78 yo female who presents on 04/03/23 after suspected intentional overdose at home. Took 44 trazadone 50mg  and 44 lasix  20 mg and vomited. PMH: dementia, remote R hip surgeries, L hip surgery 4/24,  Parkinsons, HTN, HLD, insomnia, MDD  Clinical Impression  Pt admitted with above diagnosis. Pt received on stretcher, daughter in law present. Pt pleasant and oriented to place and self, able to give info about home set up but not a detailed history or recent events or past surgeries other than that her LLE is weaker than R due to hip surgery. Pt from home with husband. Daughter in law reports that he ambulates with a cane and cannot physically assist pt. Pt needed mod A to come to sitting edge of stretcher as well as mod A to stand. Pt took pivot steps to [facility] to urinate. Was unable to stand and let go of RW with one hand to perform perineal care but needed assist from PT for this. Pt ambulated 10' with RW and min A and took 10+ mins to do this. Recommend PT at inpt psych if this is where she goes or continued inpatient follow up therapy, <3 hours/day.  Pt currently with functional limitations due to the deficits listed below (see PT Problem List). Pt will benefit from acute skilled PT to increase their independence and safety with mobility to allow discharge.           If plan is discharge home, recommend the following: A little help with walking and/or transfers;A little help with bathing/dressing/bathroom;Assistance with cooking/housework;Assist for transportation;Help with stairs or ramp for entrance;Supervision due to cognitive status   Can travel by private vehicle        Equipment Recommendations None recommended by PT  Recommendations for Other Services  OT consult    Functional Status Assessment Patient has had a recent decline in their  functional status and demonstrates the ability to make significant improvements in function in a reasonable and predictable amount of time.     Precautions / Restrictions Precautions Precautions: Fall Restrictions Weight Bearing Restrictions Per Provider Order: No      Mobility  Bed Mobility Overal bed mobility: Needs Assistance Bed Mobility: Rolling, Sidelying to Sit, Sit to Supine Rolling: Supervision Sidelying to sit: Mod assist   Sit to supine: Mod assist   General bed mobility comments: pt able to roll R and L with use of rail. Pt needed mod A at trunk to come to sitting EOB and had difficulty scooting hips to EOB. Pt unable to scoot hips bkwd onto bed and unable to lift LLE to return to supine. Mod A needed for these mvmts.    Transfers Overall transfer level: Needs assistance Equipment used: Rolling walker (2 wheels) Transfers: Sit to/from Stand, Bed to chair/wheelchair/BSC Sit to Stand: Mod assist, Min assist   Step pivot transfers: Min assist       General transfer comment: mod A needed to stand from stretcher. Able to stand from [facility] with use of arm rest with min A. Had difficulty stepping L foot with transfer to [facility]    Ambulation/Gait Ambulation/Gait assistance: Min assist Gait Distance (Feet): 10 Feet Assistive device: Rolling walker (2 wheels) Gait Pattern/deviations: Step-through pattern, Decreased stride length, Trunk flexed Gait velocity: decreased Gait velocity interpretation: <1.31 ft/sec, indicative of household ambulator   General Gait Details: pt with very slow  ambulation and fatigues quickly. Min A needed to steady with RW. Pace not functional to be able to get into bathroom in time if needed. Was able to pivot to [facility] though before beginning to urinate  Stairs            Wheelchair Mobility     Tilt Bed    Modified Rankin (Stroke Patients Only)       Balance Overall balance assessment: Needs assistance Sitting-balance support: Feet  unsupported, Bilateral upper extremity supported Sitting balance-Leahy Scale: Fair Sitting balance - Comments: close guarding in sitting   Standing balance support: Bilateral upper extremity supported, During functional activity Standing balance-Leahy Scale: Poor Standing balance comment: required BUE support in standing. Could not safely let go of RW to perform perineal care. Needed assist of PT to do this.                             Pertinent Vitals/Pain Pain Assessment Pain Assessment: Faces Faces Pain Scale: Hurts little more Pain Location: soreness L hip Pain Descriptors / Indicators: Sore Pain Intervention(s): Monitored during session    Home Living Family/patient expects to be discharged to:: Private residence Living Arrangements: Spouse/significant other Available Help at Discharge: Family;Available 24 hours/day Type of Home: House Home Access: Ramped entrance       Home Layout: One level Home Equipment: Agricultural Consultant (2 wheels);BSC/3in1;Hospital bed Additional Comments: lives with husband who ambulates with cane, he cannot physically help her. Sons involved    Prior Function Prior Level of Function : Needs assist             Mobility Comments: ambulates with RW, has had difficulty getting up from chair and performing self care activities ADLs Comments: pt reports she has been doing this independently but it has been getting progressively harder.     Extremity/Trunk Assessment   Upper Extremity Assessment Upper Extremity Assessment: Generalized weakness    Lower Extremity Assessment Lower Extremity Assessment: Generalized weakness;LLE deficits/detail LLE Deficits / Details: hip flex 2+/5, can slide LLE along bed but has difficulty lifting it against gravity. LLE Sensation: WNL LLE Coordination: decreased gross motor    Cervical / Trunk Assessment Cervical / Trunk Assessment: Kyphotic  Communication   Communication Communication: No  apparent difficulties Cueing Techniques: Verbal cues;Gestural cues  Cognition Arousal: Alert Behavior During Therapy: Flat affect Overall Cognitive Status: History of cognitive impairments - at baseline                                 General Comments: pt is pleasant, can answer basic questions about home, daughter in law confirming. Has difficult relaying curcumstances of hip surgeries but verbalizes that LLE is weaker than R.        General Comments General comments (skin integrity, edema, etc.): VSS on RA. Daughter in law present.    Exercises     Assessment/Plan    PT Assessment Patient needs continued PT services  PT Problem List Decreased strength;Decreased activity tolerance;Decreased balance;Decreased mobility;Decreased coordination;Decreased range of motion;Decreased cognition;Decreased safety awareness;Decreased knowledge of precautions;Decreased knowledge of use of DME       PT Treatment Interventions DME instruction;Gait training;Functional mobility training;Therapeutic activities;Therapeutic exercise;Balance training;Patient/family education    PT Goals (Current goals can be found in the Care Plan section)  Acute Rehab PT Goals Patient Stated Goal: none stated PT Goal Formulation: With patient/family Time For Goal Achievement:  04/18/23 Potential to Achieve Goals: Fair    Frequency Min 1X/week     Co-evaluation               AM-PAC PT 6 Clicks Mobility  Outcome Measure Help needed turning from your back to your side while in a flat bed without using bedrails?: None Help needed moving from lying on your back to sitting on the side of a flat bed without using bedrails?: A Lot Help needed moving to and from a bed to a chair (including a wheelchair)?: A Lot Help needed standing up from a chair using your arms (e.g., wheelchair or bedside chair)?: A Lot Help needed to walk in hospital room?: A Lot Help needed climbing 3-5 steps with a  railing? : Total 6 Click Score: 13    End of Session   Activity Tolerance: Patient tolerated treatment well Patient left: in bed;with call bell/phone within reach;with family/visitor present Nurse Communication: Mobility status PT Visit Diagnosis: Unsteadiness on feet (R26.81);Muscle weakness (generalized) (M62.81);Difficulty in walking, not elsewhere classified (R26.2)    Time: 8757-8674 PT Time Calculation (min) (ACUTE ONLY): 43 min   Charges:   PT Evaluation $PT Eval Moderate Complexity: 1 Mod PT Treatments $Gait Training: 8-22 mins $Therapeutic Activity: 8-22 mins PT General Charges $$ ACUTE PT VISIT: 1 Visit         Richerd Lipoma, PT  Acute Rehab Services Secure chat preferred Office [phone]   Richerd CROME Marget Outten 04/04/2023, 2:03 PM

## 2023-04-04 NOTE — Progress Notes (Signed)
 Patient has been denied by [facility] due to no appropriate beds available. Patient meets BH inpatient criteria per [name], NP. Patient has been faxed out to the following facilities:   [facility] [address] [place] [address] [phone]  [facility] Health Patient Placement [facility], [place] [phone]  [facility] [address] [place] [address] [phone]  [facility]-Geriatric [address] [place] [address] [phone]  [facility] [address] [place] [address] [phone]  [facility] [address] [place] [address] [phone]  [facility] EFAX [address], [place] [phone]  [facility] [address], [place] [address] [phone]  [facility] [address] [place], [place] [address] [phone]  [facility] [address] [place] [address] [phone]  [facility] [address] [place] [address] [phone]  [facility] [address], [place] [address] [phone]  [facility] [address] [place] [address] [phone]  [facility] [address], [place] [address] [phone]  [facility] [address] [place], [place] [address] [phone]  [facility] [address] [place] [address] [phone]  [facility] [address] [place] [address] [phone]  [facility] [address] [place] [place] [address] [phone]   Bunnie Gallop, MSW, LCSW-A  2:29 PM 04/04/2023

## 2023-04-04 NOTE — ED Provider Notes (Signed)
 I assumed care of this patient from previous provider.  Please see their note for further details of history, exam, and MDM.   Briefly patient is a 78 y.o. female who presented with a history of mild dementia here for reported overdose on trazodone  and Lasix .  Patient is hemodynamically stable, alert and oriented x 3.  Labs relatively reassuring.  Plan to repeat labs at 2a, reassess for medical clearance and consult behavioral health  During stay, patient noted to have downtrending blood pressures.  Required IV fluid bolus.  Monitor for additional several hours.  Blood pressure stabilized with systolics in the 110s to 120s.  Repeat labs reassuring.  Now medically cleared for behavioral health evaluation  .Critical Care  Performed by: Trine Raynell Moder, MD Authorized by: Trine Raynell Moder, MD   Critical care provider statement:    Critical care time (minutes):  34   Critical care was necessary to treat or prevent imminent or life-threatening deterioration of the following conditions: acute overdose, hypotension.   Critical care was time spent personally by me on the following activities:  Development of treatment plan with patient or surrogate, discussions with consultants, evaluation of patient's response to treatment, examination of patient, ordering and review of laboratory studies, ordering and review of radiographic studies, ordering and performing treatments and interventions, pulse oximetry, re-evaluation of patient's condition and review of old charts   I assumed direction of critical care for this patient from another provider in my specialty: yes           Noam Karaffa, Raynell Moder, MD 04/04/23 [phone]

## 2023-04-05 NOTE — ED Notes (Signed)
 No visitor time restriction for pt. Family allowed to visit outside of normal BH visitation times d/t pt needs family and familiarity for her care while here at [facility].

## 2023-04-05 NOTE — ED Notes (Signed)
 Pts brief changed

## 2023-04-05 NOTE — Progress Notes (Signed)
 Patient has been denied by [facility] due to no appropriate beds available. Patient meets BH inpatient criteria per [name], NP. Patient has been faxed out to the following facilities:   [facility] [address] [place] [address] [phone]  [facility] Patient Placement [facility], [place] [phone]  [facility] [address] [place] [address] [phone]  [facility] Center-Geriatric [address] [place] [address] [phone]  [facility] [address] [place] [address] [phone]  [facility] [address] [place] [address] [phone]  [facility] EFAX [address] [place] [phone]  [facility] [address] [place] [address] [phone]  [facility] [address] [place], [place] [address] [phone]  [facility] [address], [place] [address] [phone]  [facility] [address] [place] [address] [phone]  [facility] [address] [place], [place] [address] [phone]  [facility] [address], [place] [address] [phone]  [facility] [address] [place] [address] [phone]  [facility] [address] [place], [place] [address] [phone]  [facility] [address] [place] [address] [phone]  [facility] [address] [place] [address] [phone]  [facility] [address] [place] [address] [phone]   Bunnie Gallop, MSW, LCSW-A  9:47 AM 04/05/2023

## 2023-04-05 NOTE — ED Notes (Signed)
 Patient noted to try to get out of bed when assessed patient said she was uncomfortable, patient repositioned, patient stated she is more comfortable now.

## 2023-04-05 NOTE — ED Notes (Signed)
 Assisted pt to bedside commode (standby to x1 assist)

## 2023-04-05 NOTE — ED Notes (Signed)
 No sitters per staffing

## 2023-04-05 NOTE — ED Provider Notes (Signed)
 Emergency Medicine Observation Re-evaluation Note  Barbara Bradshaw is a 78 y.o. female, seen on rounds today.  Pt initially presented to the ED for complaints of Drug Overdose Currently, the patient is resting without complaint.  Physical Exam  BP (!) 95/50 (BP Location: Left Arm)   Pulse 85   Temp 97.9 F (36.6 C) (Oral)   Resp 16   SpO2 98%  Physical Exam Cardiovascular:     Rate and Rhythm: Normal rate.  Pulmonary:     Effort: Pulmonary effort is normal.  Neurological:     General: No focal deficit present.     Mental Status: She is alert.      ED Course / MDM  EKG:EKG Interpretation Date/Time:  Friday April 03 2023 19:52:53 EST Ventricular Rate:  87 PR Interval:  182 QRS Duration:  143 QT Interval:  390 QTC Calculation: 470 R Axis:   16  Text Interpretation: Sinus rhythm Atrial premature complex Left bundle branch block Confirmed by Bari Pfeiffer ([id number]) on 04/03/2023 7:55:17 PM  I have reviewed the labs performed to date as well as medications administered while in observation.  Recent changes in the last 24 hours include no changes.  Plan  Current plan is for inpatient placement.    Mannie Pac T, DO 04/05/23 1936

## 2023-04-06 DIAGNOSIS — F32A Depression, unspecified: Secondary | ICD-10-CM

## 2023-04-06 DIAGNOSIS — T1491XA Suicide attempt, initial encounter: Secondary | ICD-10-CM

## 2023-04-06 MED ORDER — TRAZODONE HCL 50 MG PO TABS: 50.0000 mg | ORAL_TABLET | Freq: Every evening | ORAL | 0 refills | Status: AC | PRN

## 2023-04-06 MED ORDER — MIRTAZAPINE 7.5 MG PO TABS: 7.5000 mg | ORAL_TABLET | Freq: Every day | ORAL | 0 refills | Status: AC

## 2023-04-06 NOTE — Progress Notes (Signed)
 LCSW Progress Note  [id number]   Barbara Bradshaw  04/06/2023  9:08 AM  Description:   Inpatient Psychiatric Referral  Patient was recommended inpatient per [facility]. There are [facility] available beds at [facility], per [facility] Cherylynn Ernst RN. Patient was referred to the following out of network facilities:   [facility] Provider Address Phone [facility] [address] [place] [address] [phone]  [facility] Patient Placement [facility], [place] [phone]  [facility] [address] [place] [address] [phone]  [facility] Center-Geriatric [address] [place] [address] [phone]  [facility] [address] [place] [address] [phone]  [facility] [address] [place] [address] [phone]  [facility] EFAX [address] [place] [phone]  [facility] [address] [place] [address] [phone]  [facility] [address] [place], [place] [address] [phone]  [facility] [address], [place] [address] [phone]  [facility] [address], [place] [address] [phone]  [facility] [address] [place], [place] [address] [phone]  [facility] [address], [place] [address] [phone]  [facility] [address], [place] [address] [phone]  [facility] [address] [place], [place] [address] [phone]   [facility] [address] [place] [address] [phone]  [facility] [address] [place] [address] [phone]  [facility] [address] [place] [address] [phone]      Situation ongoing, CSW to continue following and update chart as more information becomes available.      [place] Jadasia Haws, MSW, LCSW  04/06/2023 9:08 AM

## 2023-04-06 NOTE — ED Provider Notes (Signed)
 The patient spouse has arrived to pick her up, she has been stable here, workup has been unremarkable, no long-term effects of overdose, stable for discharge at this time   Eber Hong, MD 04/06/23 1117

## 2023-04-06 NOTE — ED Notes (Signed)
 Pt was refusing to take bedtime medication.Pt son Freida Busman visited with spouse, this RN explained that pt was refusing medication, son was able to convince pt to take medication as ordered.

## 2023-04-06 NOTE — Consult Note (Signed)
 [facility] Psychiatric Consult follow up  Patient Name: .Barbara Bradshaw  MRN: [id number]  DOB: 1945-10-19  Consult Order details:  Orders (From admission, onward)     Start     Ordered   04/04/23 0604  CONSULT TO CALL ACT TEAM       Comments: overdose  Ordering Provider: Trine Raynell Moder, MD  Provider:  (Not yet assigned)  Question:  Reason for Consult?  Answer:  Psych consult   04/04/23 0603             Mode of Visit: In person    Psychiatry Consult Evaluation  Service Date: April 06, 2023 LOS: 3 days Chief Complaint suicide attempt  Primary Psychiatric Diagnoses  Suicide attempt 2.  MDD 3.    Assessment  Barbara Bradshaw is a 78 y.o. female admitted: Presented to the EDfor 04/03/2023  7:42 PM for suicide attempt. She carries the psychiatric diagnoses of MDD.  Her current presentation of depression is most consistent with MDD. She meets criteria for MDD based on suicide attempt, anhedonia, depressed mood, disturbed sleep and appetite.  Current outpatient psychotropic medications include trazodone  and historically she has had a positive response to these medications. She was  compliant with medications prior to admission as evidenced by patient report.   Today, patient appears with bright affect, pleasant mood, alert and oriented x 4.  Denies any adverse effects from mirtazapine .  She denies SI/HI/AVH x 48 hours.  Patient is requesting to discharge home versus waiting for inpatient bed.  Husband Barbara Bradshaw is also present and requesting discharge home.  Patient does have many barriers for inpatient treatment such as dementia diagnosis, limited mobility, requiring a walker and a 1+ assist for ADLs.  Antidepressant medication was started while in the ED.  Extensive safety planning done by provider with Barbara Bradshaw and the patient.  At this time I do feel comfortable psych clearing patient to return home versus waiting in the emergency department indefinitely for an inpatient unit.   Patient is at high risk for delirium while waiting in the ED, especially with her baseline dementia.  Discussed return precautions to Barbara Bradshaw and the patient.  Environment has already been made safe at home by removing loose medications out of reach.  She will be initiating therapy again as well.  I offered for patient to remain in the ED while we continue to search for an inpatient unit if the patient and husband felt like it was needed.  Again, they both declined and both urged for her to be discharged home today.  Risk and benefits were discussed.  Patient is psych cleared to discharge home.  Diagnoses:  Active Hospital problems: Principal Problem:   MDD (major depressive disorder), recurrent episode, severe (HCC) Active Problems:   Suicide attempt (HCC)    Plan   ## Psychiatric Medication Recommendations:  Continue home meds Add Remeron  7.5 mg Qhs  ## Medical Decision Making Capacity: Not specifically addressed in this encounter  ## Further Work-up:  TSH, B12, folate, EKG, U/A, or UDS -- most recent EKG on 04/03/23 had QtC of 481 -- Pertinent labwork reviewed earlier this admission includes:    ## Disposition:-- psych clear to discharge home  ## Behavioral / Environmental: -Delirium Precautions: Delirium Interventions for Nursing and Staff: - RN to open blinds every AM. - To Bedside: Glasses, hearing aide, and pt's own shoes. Make available to patients. when possible and encourage use. - Encourage po fluids when appropriate, keep fluids within reach. - OOB  to chair with meals. - Passive ROM exercises to all extremities with AM & PM care. - RN to assess orientation to person, time and place QAM and PRN. - Recommend extended visitation hours with familiar family/friends as feasible. - Staff to minimize disturbances at night. Turn off television when pt asleep or when not in use.    ## Safety and Observation Level:  - Based on my clinical evaluation, I estimate the patient to be at high  moderate of self harm in the current setting. - At this time, we recommend  routine. This decision is based on my review of the chart including patient's history and current presentation, interview of the patient, mental status examination, and consideration of suicide risk including evaluating suicidal ideation, plan, intent, suicidal or self-harm behaviors, risk factors, and protective factors. This judgment is based on our ability to directly address suicide risk, implement suicide prevention strategies, and develop a safety plan while the patient is in the clinical setting. Please contact our team if there is a concern that risk level has changed.  CSSR Risk Category:C-SSRS RISK CATEGORY: No Risk  Suicide Risk Assessment: Patient has following modifiable risk factors for suicide: under treated depression , which we are addressing by inpatient admission. Patient has following non-modifiable or demographic risk factors for suicide: psychiatric hospitalization Patient has the following protective factors against suicide: Supportive family, Supportive friends, and Cultural, spiritual, or religious beliefs that discourage suicide  Thank you for this consult request. Recommendations have been communicated to the primary team.  We will recommend psych clearance for discharge at this time.   [name], NP       History of Present Illness  Relevant Aspects of Hospital ED Course:  Admitted on 04/03/2023 for suicide attempt.  This is a 78 year old female who presents with reported overdose. Patient was brought in by EMS. She reportedly lives with her husband. Presumptively took as many as 4450 mg trazodone  and 4420 mg Lasix  at around 6:30 PM. Per EMS she vomited x 1. She is awake, alert, oriented x 3. Reported baseline of dementia. She reportedly was sitting by empty pill bottles and told her husband that she ingested these medications; however she does not endorse this to me. She denies any pain.  She only states that she is cold.   Pt medically cleared at 0600 by Dr. Trine. Briefly patient is a 78 y.o. female who presented with a history of mild dementia here for reported overdose on trazodone  and Lasix .  Patient is hemodynamically stable, alert and oriented x 3.  Labs relatively reassuring.  Plan to repeat labs at 2a, reassess for medical clearance and consult behavioral health   During stay, patient noted to have downtrending blood pressures.  Required IV fluid bolus.  Monitor for additional several hours.  Blood pressure stabilized with systolics in the 110s to 120s.  Repeat labs reassuring.   Now medically cleared for behavioral health evaluation  Patient Report:  [facility] seen at [facility] ED for face-to-face psychiatric evaluation.  Today patient has bright affect, is alert and oriented x 4.  Her husband is present, she wishes for him to stay during assessment.  Patient states she has not been feeling suicidal yesterday or today.  Patient stated she has had time to think about her actions and she does not know why she did it.  She is remorseful, grateful to be alive.  She does not want to die and wants to live for her husband, children, and  grandchildren.  She denies active suicidal ideations.  Denies homicidal ideations.  Denies any auditory visual hallucinations.  She denies any adverse effects from mirtazapine  7.5 mg being started.  She does feel like she still needs her trazodone  to assist her falling asleep.  She mentions that she previously went to a therapist once a month that did help her a lot, however that therapist retired and she has not had one for several months.  She also had not been sleeping well which is why she thinks she impulsively had the overdose.  Patient stated she wishes to move on and she does wish to discharge home today.  Psych ROS:  Depression: yes Anxiety:  yes Mania (lifetime and current): no Psychosis: (lifetime and current): no  Collateral  information:  Her husband, Barbara Bradshaw, was in the room for assessment.  He also wishes for her to discharge home today.  He is aware of the seriousness of the suicide attempt, has already taken in precautions and removed the medications from the cabinet and to somewhere out of reach and she does not know the location.  He will give her medications daily that way there are no loose pill bottles around.  They are currently working on installing rails in the house to help her get around better.  I did explain to him the difficulty with securing inpatient treatment is her limited mobility, and requiring a 1 assist and walker.  I did explain she can remain in the ED and we can continue to search for an inpatient facility if he does not feel safe for returning home.  However Barbara Bradshaw declined and does feel comfortable with her coming home today, and would rather her be at home versus sitting in the ED.  He has a good safety and follow-up plan in place, he has already been trying to establish new therapist for her.  He feels comfortable with discharge home today.  Turn precautions discussed.  Review of Systems  Psychiatric/Behavioral:  Positive for depression and suicidal ideas. The patient has insomnia.      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 rate 18, SpO2 94%. There is no height or weight on file to calculate BMI.  Physical Exam Neurological:     Mental Status: She is alert and oriented to person, place, and time.     Mental Status Exam: General Appearance: Fairly Groomed  Orientation:  Full (Time, Place, and Person)  Memory:  Immediate;   Fair Recent;   Fair  Concentration:  Concentration: Fair  Recall:  Fair  Attention  Fair  Eye Contact:  Fair  Speech:  Clear and Coherent and Slow  Language:  Fair  Volume:  Decreased  Mood: much better  Affect:  appropriate  Thought Process:  Goal Directed  Thought Content:  WDL  Suicidal Thoughts:   No  Homicidal Thoughts:  No  Judgement:  Fair  Insight:  Fair  Psychomotor Activity:  Normal  Akathisia:  No  Fund of Knowledge:  Fair      Assets:  Communication Skills Desire for Improvement Housing Intimacy Leisure Time Resilience Social Support  Cognition:  WNL  ADL's:  Impaired  AIMS (if indicated):        Other History   These have been pulled in through the EMR, reviewed, and updated if appropriate.  Family History:  The patient's family history includes Cancer in her mother; Dementia in her mother; Diabetes in her father; Heart disease in her mother; Hypertension in her mother.  Medical History: Past Medical History:  Diagnosis Date   Acute blood loss anemia    Bilateral edema of lower extremity    Diabetes mellitus, type 2 (HCC)    Hyperlipidemia    Hypertension    Insomnia    Intertrochanteric fracture of right hip (HCC)    Major depression, chronic    Nephrolithiasis    Parkinson's disease  (HCC) 12/2011   Rosacea    Slow transit constipation    Thrombocytopenia (HCC)    Unsteady gait     Surgical History: Past Surgical History:  Procedure Laterality Date   right arm fx with ORIF     TUBAL LIGATION       Medications:   Current Facility-Administered Medications:    atorvastatin  (LIPITOR) tablet 20 mg, 20 mg, Oral, Daily, Cardama, Raynell Moder, MD, 20 mg at 04/06/23 9088   carbidopa -levodopa  (SINEMET  IR) 25-100 MG per tablet immediate release 2 tablet, 2 tablet, Oral, TID, Cardama, Raynell Moder, MD, 2 tablet at 04/06/23 0911   furosemide  (LASIX ) tablet 40 mg, 40 mg, Oral, Daily, Cardama, Raynell Moder, MD, 40 mg at 04/06/23 9088   metFORMIN  (GLUCOPHAGE -XR) 24 hr tablet 500 mg, 500 mg, Oral, BID WC, Cardama, Raynell Moder, MD, 500 mg at 04/06/23 9087   mirtazapine  (REMERON ) tablet 7.5 mg, 7.5 mg, Oral, QHS, Mason Burleigh, Nash, NP, 7.5 mg at 04/05/23 2102   naproxen  (NAPROSYN ) tablet 250 mg, 250 mg, Oral, Daily PRN, Cardama, Raynell Moder, MD  Current Outpatient Medications:    aspirin 81 MG tablet, Take 81 mg by mouth daily.  , Disp: , Rfl:    atorvastatin  (LIPITOR) 20 MG tablet, Take 20 mg by mouth daily., Disp: , Rfl:    Calcium  Carbonate-Vitamin D (CALCIUM -VITAMIN D PO), Take 1 tablet by mouth daily., Disp: , Rfl:    carbidopa -levodopa  (SINEMET  IR) 25-100 MG tablet, TAKE 2 TABLETS BY MOUTH THREE TIMES DAILY.(TK AT 8AM, NOON, AND 4PM), Disp: 540 tablet, Rfl: 0   Cholecalciferol (VITAMIN D3) 1000 units CAPS, Take by mouth., Disp: , Rfl:    Fish Oil-Krill Oil (KRILL OIL PLUS PO), Take 1 capsule by mouth daily., Disp: , Rfl:    furosemide  (LASIX ) 40 MG tablet, TAKE 1 TABLET BY MOUTH EVERY DAY, Disp: 90 tablet, Rfl: 1   metFORMIN  (GLUCOPHAGE -XR) 500 MG 24 hr tablet, Take 500 mg by mouth 2 (two) times daily with a meal., Disp: , Rfl:    Multiple Vitamin (MULTIVITAMIN) tablet, Take 1 tablet by mouth daily., Disp: , Rfl:    naproxen  sodium (ALEVE ) 220 MG tablet,  Take 220 mg  by mouth daily as needed (for pain)., Disp: , Rfl:    rosuvastatin  (CRESTOR ) 10 MG tablet, Take 1 tablet by mouth daily., Disp: , Rfl:    vitamin C (ASCORBIC ACID) 500 MG tablet, Take 500 mg by mouth. During flu season only, Disp: , Rfl:    VITAMIN E PO, Take 1 capsule by mouth daily., Disp: , Rfl:    Zinc Sulfate (ZINC 15 PO), Take by mouth. During flu season only, Disp: , Rfl:    glucose blood (ONE TOUCH ULTRA TEST) test strip, 1 each by Other route daily. Use to check blood sugars daily E11.8, Disp: 100 each, Rfl: 11   Lancets (ONETOUCH ULTRASOFT) lancets, Use as instructed, Disp: 100 each, Rfl: 12   mirtazapine  (REMERON ) 7.5 MG tablet, Take 1 tablet (7.5 mg total) by mouth at bedtime., Disp: 30 tablet, Rfl: 0   traZODone  (DESYREL ) 50 MG tablet, Take 1 tablet (50 mg total) by mouth at bedtime as needed for sleep., Disp: 30 tablet, Rfl: 0  Allergies: Allergies  Allergen Reactions   Latex Rash    [name], NP

## 2023-04-06 NOTE — Progress Notes (Signed)
 Per provider patient has been [facility] clear. At this time dispositions will sign off.   [place]oor Reta LCSW-A   04/06/2023 10:25 AM

## 2023-04-06 NOTE — ED Provider Notes (Signed)
 Emergency Medicine Observation Re-evaluation Note  Barbara Bradshaw is a 78 y.o. female, seen on rounds today.  Pt initially presented to the ED for complaints of Drug Overdose Currently, the patient is resting comfortably without complaint, she initially arrived after taking large overdose of trazodone  and furosemide , not safe to go home, found by nurse practitioner with psychiatry to be qualified for inpatient psych placement but no behavioral health beds available locally.  Currently working on outside facilities for placement.  Physical Exam  BP 103/62 (BP Location: Left Arm)   Pulse 85   Temp 98 F (36.7 C) (Oral)   Resp 16   SpO2 96%  Physical Exam General: No distress Cardiac: Normal heart rate Lungs: Normal work of breathing Psych: Calm and redirectable  ED Course / MDM  EKG:EKG Interpretation Date/Time:  Saturday April 04 2023 03:23:11 EST Ventricular Rate:  68 PR Interval:  167 QRS Duration:  148 QT Interval:  452 QTC Calculation: 481 R Axis:   13  Text Interpretation: Sinus rhythm Left bundle branch block Confirmed by Geroldine Berg ([id number]) on 04/05/2023 10:26:22 PM  I have reviewed the labs performed to date as well as medications administered while in observation.  Recent changes in the last 24 hours include patient did not want to take her medications last night, family member was able to assist in helping her to take these medications.  Plan  Current plan is for psychiatric placement.    Cleotilde Rogue, MD 04/06/23 0800

## 2023-04-06 NOTE — Discharge Instructions (Addendum)
 Christian Based Counseling in [place]  [facility] - [address], [place]   #[phone]  - [facility] - [address] [place]  [phone]  - Marina  [place] -providing biblical counseling, a licensed profession counselor   [phone]  Behavioral Health Resources:  Intensive Outpatient Programs: [facility]      [address] [place], [place] [phone] Both a day and evening program       [facility] Outpatient     [address]        [place], [place] [address] [phone]         ADS: [facility] [address] [place] [phone]  [facility] Mental Health ACCESS LINE: [phone] or [phone] [address] [place], [place] [address] [email or web address]   Substance Abuse Resources: [facility]  [phone] [facility] [phone] [facility] [phone] [place] [phone] Residential & Outpatient Substance Abuse Program  [phone]  Psychological Services: [facility]  [phone] [facility]  [phone] [facility], [phone] [place]. [address], [place], ACCESS LINE: [phone] or [phone], [email or web address]  Mobile Crisis Teams:                                        [facility]         Mobile Crisis Care Unit [phone]             [facility] [address] [place] [phone]                                         [facility] [address] [place] [phone]  Self-Help/Support Groups: [facility] of [employer] of support groups [phone] (call for more info)  Narcotics Anonymous (NA) Caring Services [address] [place] - 2 meetings at this  location  Residential Treatment Programs:  [facility]      [address]        [place]       [phone]         [facility] [address] [place] [address] [place], [place]  [address] [phone]  [facility]  [address] [place], [place] [address] [phone] Admissions: 8am-3pm M-F  [facility]     [address]        [place], [place] [address]       [phone]         [facility] [address] Christianna COYER [place], [place] [phone]  [facility] [address] [place], [place] [phone]  [facility] - Intensive Outpatient      [address]     [place], [place]       [phone]         [facility] ([facility]     [address] [place], [place] [phone] or [phone]  [facility] Services ([facility]), Medicaid [address] [place], [place] [phone]  [facility]  [address] [place] [phone]  [facility] Resources: [phone]               General Therapy                                                Mliss Rival, PhD        [address] [place], [place] [address]         [phone]   Insurance  [facility]   [address] [place], [place] [address] [phone]  [facility] Recovery [address] [place], [place] [address] [phone] Insurance/Medicaid/sponsorship through [facility]                                              [address]                                        [place], [place] [address]    Therapy/tele-psych/case         [phone]          [facility] [address], [place]  [address]  Adolescent/group home/case management [phone]                                            Recardo Rival PhD       General therapy       Insurance   [phone]         Dr. Curry, Insurance, M-F [phone]  [facility] [place]  [employer] [facility] Dept. [address]                 [address]         [address] [place] [address]  Tinnie Keenan Keenan Phone:  [phone]                                  Phone:  [phone]                   Phone:  [phone]  [facility], [phone] [facility] [place]- [phone]       -     [facility] in [place], [address]             [phone], Insurance

## 2023-04-29 ENCOUNTER — Telehealth (HOSPITAL_COMMUNITY): Payer: Self-pay | Admitting: *Deleted

## 2023-04-29 NOTE — Telephone Encounter (Signed)
VM received from pt's husband, Shanvi Moyd, regarding a refill of Mirtazapine 7.5 mg. Per chart this medication was prescribed for #30 with no refills on 04/06/23 by [facility] provider. Pt does not have appointment with [place] and I don't see any f/u with psychiatry per ED notes. We are booked out several months for new pts. We have a psychiatrist who sees geropsychiatry pts but he is booked out several months. I attempted to reach out with this information but did not receive any answer at the number husband provided.
